# Patient Record
Sex: Male | Born: 1945
Health system: Southern US, Community
[De-identification: ages and names within clinical notes are randomized; demographics above are authoritative.]

## PROBLEM LIST (undated history)

## (undated) DIAGNOSIS — N400 Enlarged prostate without lower urinary tract symptoms: Secondary | ICD-10-CM

## (undated) DIAGNOSIS — D126 Benign neoplasm of colon, unspecified: Secondary | ICD-10-CM

## (undated) DIAGNOSIS — K5790 Diverticulosis of intestine, part unspecified, without perforation or abscess without bleeding: Secondary | ICD-10-CM

## (undated) DIAGNOSIS — I10 Essential (primary) hypertension: Secondary | ICD-10-CM

## (undated) DIAGNOSIS — M1990 Unspecified osteoarthritis, unspecified site: Secondary | ICD-10-CM

## (undated) DIAGNOSIS — N529 Male erectile dysfunction, unspecified: Secondary | ICD-10-CM

## (undated) DIAGNOSIS — IMO0002 Reserved for concepts with insufficient information to code with codable children: Secondary | ICD-10-CM

## (undated) DIAGNOSIS — E785 Hyperlipidemia, unspecified: Secondary | ICD-10-CM

## (undated) HISTORY — PX: TOTAL HIP ARTHROPLASTY: SHX124

## (undated) HISTORY — DX: Benign neoplasm of colon, unspecified: D12.6

## (undated) HISTORY — DX: Male erectile dysfunction, unspecified: N52.9

## (undated) HISTORY — PX: TONSILLECTOMY: SUR1361

## (undated) HISTORY — PX: LUMBAR LAMINECTOMY: SHX95

## (undated) HISTORY — DX: Unspecified osteoarthritis, unspecified site: M19.90

## (undated) HISTORY — DX: Essential (primary) hypertension: I10

## (undated) HISTORY — DX: Reserved for concepts with insufficient information to code with codable children: IMO0002

## (undated) HISTORY — PX: EYE SURGERY: SHX253

## (undated) HISTORY — DX: Diverticulosis of intestine, part unspecified, without perforation or abscess without bleeding: K57.90

## (undated) HISTORY — DX: Benign prostatic hyperplasia without lower urinary tract symptoms: N40.0

## (undated) HISTORY — PX: REPLANTATION FINGER: SUR1229

## (undated) HISTORY — DX: Hyperlipidemia, unspecified: E78.5

---

## 1995-10-12 HISTORY — PX: CERVICAL FUSION: SHX112

## 2003-06-20 DIAGNOSIS — D126 Benign neoplasm of colon, unspecified: Secondary | ICD-10-CM

## 2003-06-20 HISTORY — DX: Benign neoplasm of colon, unspecified: D12.6

## 2005-05-25 ENCOUNTER — Ambulatory Visit (HOSPITAL_COMMUNITY): Admission: RE | Admit: 2005-05-25 | Discharge: 2005-05-25 | Payer: Self-pay | Admitting: Urology

## 2007-02-15 ENCOUNTER — Ambulatory Visit: Payer: Self-pay | Admitting: Gastroenterology

## 2007-04-04 ENCOUNTER — Encounter: Payer: Self-pay | Admitting: Gastroenterology

## 2007-04-04 ENCOUNTER — Ambulatory Visit: Payer: Self-pay | Admitting: Gastroenterology

## 2011-08-11 ENCOUNTER — Emergency Department (HOSPITAL_COMMUNITY)
Admission: EM | Admit: 2011-08-11 | Discharge: 2011-08-11 | Disposition: A | Discharge status: Home / Self Care | Payer: BC Managed Care – PPO | Attending: Emergency Medicine | Admitting: Emergency Medicine

## 2011-08-11 DIAGNOSIS — Y92009 Unspecified place in unspecified non-institutional (private) residence as the place of occurrence of the external cause: Secondary | ICD-10-CM | POA: Insufficient documentation

## 2011-08-11 DIAGNOSIS — E7800 Pure hypercholesterolemia, unspecified: Secondary | ICD-10-CM | POA: Insufficient documentation

## 2011-08-11 DIAGNOSIS — S0990XA Unspecified injury of head, initial encounter: Secondary | ICD-10-CM | POA: Insufficient documentation

## 2011-08-11 DIAGNOSIS — S0100XA Unspecified open wound of scalp, initial encounter: Secondary | ICD-10-CM | POA: Insufficient documentation

## 2011-08-11 DIAGNOSIS — I10 Essential (primary) hypertension: Secondary | ICD-10-CM | POA: Insufficient documentation

## 2011-08-11 DIAGNOSIS — W010XXA Fall on same level from slipping, tripping and stumbling without subsequent striking against object, initial encounter: Secondary | ICD-10-CM | POA: Insufficient documentation

## 2012-01-05 ENCOUNTER — Encounter: Payer: Self-pay | Admitting: Gastroenterology

## 2013-01-12 ENCOUNTER — Encounter: Payer: Self-pay | Admitting: Gastroenterology

## 2013-07-13 ENCOUNTER — Encounter: Payer: Self-pay | Admitting: Gastroenterology

## 2013-08-16 ENCOUNTER — Ambulatory Visit (INDEPENDENT_AMBULATORY_CARE_PROVIDER_SITE_OTHER): Payer: Medicare Other | Admitting: Gastroenterology

## 2013-08-16 ENCOUNTER — Encounter: Payer: Self-pay | Admitting: Gastroenterology

## 2013-08-16 VITALS — BP 114/84 | HR 76 | Ht 64.17 in | Wt 167.2 lb

## 2013-08-16 DIAGNOSIS — K5732 Diverticulitis of large intestine without perforation or abscess without bleeding: Secondary | ICD-10-CM

## 2013-08-16 DIAGNOSIS — Z86010 Personal history of colonic polyps: Secondary | ICD-10-CM | POA: Insufficient documentation

## 2013-08-16 MED ORDER — METRONIDAZOLE 500 MG PO TABS: 500.0000 mg | ORAL_TABLET | Freq: Three times a day (TID) | ORAL | Status: DC

## 2013-08-16 MED ORDER — CIPROFLOXACIN HCL 500 MG PO TABS: 500.0000 mg | ORAL_TABLET | Freq: Two times a day (BID) | ORAL | Status: DC

## 2013-08-16 MED ORDER — SOD PICOSULFATE-MAG OX-CIT ACD 10-3.5-12 MG-GM-GM PO PACK: 1.0000 | PACK | ORAL | Status: DC

## 2013-08-16 NOTE — Patient Instructions (Signed)
We have sent the following medications to your pharmacy for you to pick up at your convenience: Cipro and Flagyl.  You have been scheduled for a colonoscopy with propofol. Please follow written instructions given to you at your visit today.  Please pick up your prep kit at the pharmacy within the next 1-3 days. If you use inhalers (even only as needed), please bring them with you on the day of your procedure.  Thank you for choosing me and [facility].  Venita Lick. Pleas Koch., MD., Clementeen Graham  cc: Rodrigo Ran, MD

## 2013-08-16 NOTE — Progress Notes (Signed)
    History of Present Illness: This is a 67 year old male with a history of adenomatous colon polyps and diverticulosis. He has been treated for several episodes of diverticulitis with Cipro and Flagyl over the past several years. He states he develops pain across his lower abdomen associated with mild constipation. Start antibiotics quickly his symptoms over the course of 3-5 days if there is a delay in antibiotics his symptoms worsen and persist for 5-7 days. He recently noted the onset of these symptoms began Cipro and Flagyl on his own yesterday. Denies weight loss, diarrhea, change in stool caliber, melena, hematochezia, nausea, vomiting, dysphagia, reflux symptoms, chest pain.  Review of Systems: Pertinent positive and negative review of systems were noted in the above HPI section. All other review of systems were otherwise negative.  Current Medications, Allergies, Past Medical History, Past Surgical History, Family History and Social History were reviewed in [employer] record.  Physical Exam: General: Well developed , well nourished, no acute distress Head: Normocephalic and atraumatic Eyes:  sclerae anicteric, EOMI Ears: Normal auditory acuity Mouth: No deformity or lesions Neck: Supple, no masses or thyromegaly Lungs: Clear throughout to auscultation Heart: Regular rate and rhythm; no murmurs, rubs or bruits Abdomen: Soft, minimal lower abdominal tenderness to deep palpation-primarily suprapubic area, and non distended. No masses, hepatosplenomegaly or hernias noted. Normal Bowel sounds Rectal: deferred for colonoscopy Musculoskeletal: Symmetrical with no gross deformities  Skin: No lesions on visible extremities Pulses:  Normal pulses noted Extremities: No clubbing, cyanosis, edema or deformities noted Neurological: Alert oriented x 4, grossly nonfocal Cervical Nodes:  No significant cervical adenopathy Inguinal Nodes: No significant inguinal  adenopathy Psychological:  Alert and cooperative. Normal mood and affect  Assessment and Recommendations:  1. Possible recurrent diverticulitis. Cipro and Flagyl for 10 days. Long-term high fiber diet with adequate daily water intake.  2. Personal history of adenomatous colon polyps. He is overdue for surveillance colonoscopy. Schedule colonoscopy. The risks, benefits, and alternatives to colonoscopy with possible biopsy and possible polypectomy were discussed with the patient and they consent to proceed.

## 2013-09-21 ENCOUNTER — Encounter: Payer: Self-pay | Admitting: Gastroenterology

## 2013-09-21 ENCOUNTER — Ambulatory Visit (AMBULATORY_SURGERY_CENTER): Payer: Medicare Other | Admitting: Gastroenterology

## 2013-09-21 VITALS — BP 119/77 | HR 57 | Temp 97.5°F | Resp 13 | Ht 64.0 in | Wt 167.0 lb

## 2013-09-21 DIAGNOSIS — I10 Essential (primary) hypertension: Secondary | ICD-10-CM | POA: Diagnosis not present

## 2013-09-21 DIAGNOSIS — E785 Hyperlipidemia, unspecified: Secondary | ICD-10-CM | POA: Diagnosis not present

## 2013-09-21 DIAGNOSIS — D126 Benign neoplasm of colon, unspecified: Secondary | ICD-10-CM

## 2013-09-21 DIAGNOSIS — Z86010 Personal history of colonic polyps: Secondary | ICD-10-CM

## 2013-09-21 MED ORDER — SODIUM CHLORIDE 0.9 % IV SOLN: 500.0000 mL | INTRAVENOUS | Status: DC

## 2013-09-21 NOTE — Patient Instructions (Addendum)
2 polyps removed and sent to pathology  Moderate diverticulosis Internal hemorrhoids High fiber diet recommended Repeat colonoscopy in 5 years    YOU HAD AN ENDOSCOPIC PROCEDURE TODAY AT THE [facility]: Refer to the procedure report that was given to you for any specific questions about what was found during the examination.  If the procedure report does not answer your questions, please call your gastroenterologist to clarify.  If you requested that your care partner not be given the details of your procedure findings, then the procedure report has been included in a sealed envelope for you to review at your convenience later.  YOU SHOULD EXPECT: Some feelings of bloating in the abdomen. Passage of more gas than usual.  Walking can help get rid of the air that was put into your GI tract during the procedure and reduce the bloating. If you had a lower endoscopy (such as a colonoscopy or flexible sigmoidoscopy) you may notice spotting of blood in your stool or on the toilet paper. If you underwent a bowel prep for your procedure, then you may not have a normal bowel movement for a few days.  DIET: Your first meal following the procedure should be a light meal and then it is ok to progress to your normal diet.  A half-sandwich or bowl of soup is an example of a good first meal.  Heavy or fried foods are harder to digest and may make you feel nauseous or bloated.  Likewise meals heavy in dairy and vegetables can cause extra gas to form and this can also increase the bloating.  Drink plenty of fluids but you should avoid alcoholic beverages for 24 hours.  ACTIVITY: Your care partner should take you home directly after the procedure.  You should plan to take it easy, moving slowly for the rest of the day.  You can resume normal activity the day after the procedure however you should NOT DRIVE or use heavy machinery for 24 hours (because of the sedation medicines used during the test).     SYMPTOMS TO REPORT IMMEDIATELY: A gastroenterologist can be reached at any hour.  During normal business hours, 8:30 AM to 5:00 PM Monday through Friday, call [phone].  After hours and on weekends, please call the GI answering service at [phone] who will take a message and have the physician on call contact you.   Following lower endoscopy (colonoscopy or flexible sigmoidoscopy):  Excessive amounts of blood in the stool  Significant tenderness or worsening of abdominal pains  Swelling of the abdomen that is new, acute  Fever of 100F or higher  FOLLOW UP: If any biopsies were taken you will be contacted by phone or by letter within the next 1-3 weeks.  Call your gastroenterologist if you have not heard about the biopsies in 3 weeks.  Our staff will call the home number listed on your records the next business day following your procedure to check on you and address any questions or concerns that you may have at that time regarding the information given to you following your procedure. This is a courtesy call and so if there is no answer at the home number and we have not heard from you through the emergency physician on call, we will assume that you have returned to your regular daily activities without incident.  SIGNATURES/CONFIDENTIALITY: You and/or your care partner have signed paperwork which will be entered into your electronic medical record.  These signatures attest to the fact  that that the information above on your After Visit Summary has been reviewed and is understood.  Full responsibility of the confidentiality of this discharge information lies with you and/or your care-partner.

## 2013-09-21 NOTE — Progress Notes (Signed)
Called to room to assist during endoscopic procedure.  Patient ID and intended procedure confirmed with present staff. Received instructions for my participation in the procedure from the performing physician.  

## 2013-09-21 NOTE — Progress Notes (Signed)
stable to RR 

## 2013-09-21 NOTE — Progress Notes (Signed)
Patient did not experience any of the following events: a burn prior to discharge; a fall within the facility; wrong site/side/patient/procedure/implant event; or a hospital transfer or hospital admission upon discharge from the facility. (G8907) Patient did not have preoperative order for IV antibiotic SSI prophylaxis. (G8918)  

## 2013-09-21 NOTE — Op Note (Signed)
[facility] [address] [place], [address]   COLONOSCOPY PROCEDURE REPORT  PATIENT: Frank Vance, Frank Vance  MR#: [id number] BIRTHDATE: 1946/02/03 , 67  yrs. old GENDER: Male ENDOSCOPIST: Meryl Dare, MD, [facility] PROCEDURE DATE:  09/21/2013 PROCEDURE:   Colonoscopy with snare polypectomy First Screening Colonoscopy - Avg.  risk and is 50 yrs.  old or older - No.  Prior Negative Screening - Now for repeat screening. N/A  History of Adenoma - Now for follow-up colonoscopy & has been > or = to 3 yrs.  Yes hx of adenoma.  Has been 3 or more years since last colonoscopy.  Polyps Removed Today? Yes. ASA CLASS:   Class II INDICATIONS:Patient's personal history of adenomatous colon polyps.  MEDICATIONS: MAC sedation, administered by CRNA and propofol (Diprivan) 250mg  IV DESCRIPTION OF PROCEDURE:   After the risks benefits and alternatives of the procedure were thoroughly explained, informed consent was obtained.  A digital rectal exam revealed no abnormalities of the rectum.   The LB [id number]  endoscope was introduced through the anus and advanced to the cecum, which was identified by both the appendix and ileocecal valve. No adverse events experienced.   The quality of the prep was Prepopik adequate. Spasm noted in several areas especially in the transvere, descending and sigmoid colon.  The instrument was then slowly withdrawn as the colon was fully examined.  COLON FINDINGS: Two sessile polyps measuring 5-6 mm in size were found in the transverse colon.  A polypectomy was performed with a cold snare.  The resection was complete and the polyp tissue was completely retrieved.   Moderate diverticulosis was noted in the descending colon and sigmoid colon.   The colon was otherwise normal.  There was no diverticulosis, inflammation, polyps or cancers unless previously stated.  Retroflexed views revealed moderate internal hemorrhoids. The time to cecum=1 minutes  17 seconds.  Withdrawal time=10 minutes 23 seconds.  The scope was withdrawn and the procedure completed. COMPLICATIONS: There were no complications.  ENDOSCOPIC IMPRESSION: 1.   Two sessile polyps measuring 5-6 mm in the transverse colon; polypectomy performed with a cold snare 2.   Moderate diverticulosis in the descending colon and sigmoid colon 3.   Moderate internal hemorrhoids  RECOMMENDATIONS: 1.  Await pathology results 2.  High fiber diet with liberal fluid intake. 3.  Repeat Colonoscopy in 5 years.  eSigned:  Meryl Dare, MD, Clementeen Graham 09/21/2013 2:32 PM   cc: Rodrigo Ran, MD

## 2013-09-24 ENCOUNTER — Telehealth: Payer: Self-pay | Admitting: *Deleted

## 2013-09-24 NOTE — Telephone Encounter (Signed)
Lm on vm that identifies pt by first and last name to return call if questions concerns or problems. ewm

## 2013-09-25 ENCOUNTER — Encounter: Payer: Self-pay | Admitting: Gastroenterology

## 2013-11-19 DIAGNOSIS — H6690 Otitis media, unspecified, unspecified ear: Secondary | ICD-10-CM | POA: Diagnosis not present

## 2013-11-19 DIAGNOSIS — Z6827 Body mass index (BMI) 27.0-27.9, adult: Secondary | ICD-10-CM | POA: Diagnosis not present

## 2013-11-19 DIAGNOSIS — J309 Allergic rhinitis, unspecified: Secondary | ICD-10-CM | POA: Diagnosis not present

## 2013-11-19 DIAGNOSIS — I10 Essential (primary) hypertension: Secondary | ICD-10-CM | POA: Diagnosis not present

## 2013-12-18 DIAGNOSIS — H7409 Tympanosclerosis, unspecified ear: Secondary | ICD-10-CM | POA: Diagnosis not present

## 2013-12-18 DIAGNOSIS — K5730 Diverticulosis of large intestine without perforation or abscess without bleeding: Secondary | ICD-10-CM | POA: Diagnosis not present

## 2013-12-18 DIAGNOSIS — H6120 Impacted cerumen, unspecified ear: Secondary | ICD-10-CM | POA: Diagnosis not present

## 2013-12-18 DIAGNOSIS — I10 Essential (primary) hypertension: Secondary | ICD-10-CM | POA: Diagnosis not present

## 2013-12-18 DIAGNOSIS — Z1331 Encounter for screening for depression: Secondary | ICD-10-CM | POA: Diagnosis not present

## 2014-01-16 DIAGNOSIS — L821 Other seborrheic keratosis: Secondary | ICD-10-CM | POA: Diagnosis not present

## 2014-01-16 DIAGNOSIS — D2330 Other benign neoplasm of skin of unspecified part of face: Secondary | ICD-10-CM | POA: Diagnosis not present

## 2014-01-16 DIAGNOSIS — D1801 Hemangioma of skin and subcutaneous tissue: Secondary | ICD-10-CM | POA: Diagnosis not present

## 2014-01-16 DIAGNOSIS — L739 Follicular disorder, unspecified: Secondary | ICD-10-CM | POA: Diagnosis not present

## 2014-01-22 DIAGNOSIS — Z6826 Body mass index (BMI) 26.0-26.9, adult: Secondary | ICD-10-CM | POA: Diagnosis not present

## 2014-01-22 DIAGNOSIS — R7301 Impaired fasting glucose: Secondary | ICD-10-CM | POA: Diagnosis not present

## 2014-01-22 DIAGNOSIS — K5732 Diverticulitis of large intestine without perforation or abscess without bleeding: Secondary | ICD-10-CM | POA: Diagnosis not present

## 2014-01-22 DIAGNOSIS — I10 Essential (primary) hypertension: Secondary | ICD-10-CM | POA: Diagnosis not present

## 2014-01-22 DIAGNOSIS — E785 Hyperlipidemia, unspecified: Secondary | ICD-10-CM | POA: Diagnosis not present

## 2014-05-09 DIAGNOSIS — H2510 Age-related nuclear cataract, unspecified eye: Secondary | ICD-10-CM | POA: Diagnosis not present

## 2014-07-02 DIAGNOSIS — Z23 Encounter for immunization: Secondary | ICD-10-CM | POA: Diagnosis not present

## 2014-08-01 DIAGNOSIS — Z125 Encounter for screening for malignant neoplasm of prostate: Secondary | ICD-10-CM | POA: Diagnosis not present

## 2014-08-01 DIAGNOSIS — E785 Hyperlipidemia, unspecified: Secondary | ICD-10-CM | POA: Diagnosis not present

## 2014-08-01 DIAGNOSIS — I10 Essential (primary) hypertension: Secondary | ICD-10-CM | POA: Diagnosis not present

## 2014-08-01 DIAGNOSIS — R7301 Impaired fasting glucose: Secondary | ICD-10-CM | POA: Diagnosis not present

## 2014-08-08 DIAGNOSIS — Z008 Encounter for other general examination: Secondary | ICD-10-CM | POA: Diagnosis not present

## 2014-08-08 DIAGNOSIS — M5380 Other specified dorsopathies, site unspecified: Secondary | ICD-10-CM | POA: Diagnosis not present

## 2014-08-08 DIAGNOSIS — Z1389 Encounter for screening for other disorder: Secondary | ICD-10-CM | POA: Diagnosis not present

## 2014-08-08 DIAGNOSIS — D126 Benign neoplasm of colon, unspecified: Secondary | ICD-10-CM | POA: Diagnosis not present

## 2014-08-08 DIAGNOSIS — Z87438 Personal history of other diseases of male genital organs: Secondary | ICD-10-CM | POA: Diagnosis not present

## 2014-08-08 DIAGNOSIS — I10 Essential (primary) hypertension: Secondary | ICD-10-CM | POA: Diagnosis not present

## 2014-08-08 DIAGNOSIS — E785 Hyperlipidemia, unspecified: Secondary | ICD-10-CM | POA: Diagnosis not present

## 2014-08-08 DIAGNOSIS — J309 Allergic rhinitis, unspecified: Secondary | ICD-10-CM | POA: Diagnosis not present

## 2014-08-08 DIAGNOSIS — K5790 Diverticulosis of intestine, part unspecified, without perforation or abscess without bleeding: Secondary | ICD-10-CM | POA: Diagnosis not present

## 2014-08-28 DIAGNOSIS — Z1212 Encounter for screening for malignant neoplasm of rectum: Secondary | ICD-10-CM | POA: Diagnosis not present

## 2014-10-10 DIAGNOSIS — L820 Inflamed seborrheic keratosis: Secondary | ICD-10-CM | POA: Diagnosis not present

## 2014-10-15 DIAGNOSIS — I10 Essential (primary) hypertension: Secondary | ICD-10-CM | POA: Diagnosis not present

## 2014-10-15 DIAGNOSIS — M4807 Spinal stenosis, lumbosacral region: Secondary | ICD-10-CM | POA: Diagnosis not present

## 2014-10-15 DIAGNOSIS — M5136 Other intervertebral disc degeneration, lumbar region: Secondary | ICD-10-CM | POA: Diagnosis not present

## 2014-10-23 DIAGNOSIS — M4807 Spinal stenosis, lumbosacral region: Secondary | ICD-10-CM | POA: Diagnosis not present

## 2014-10-29 DIAGNOSIS — M4807 Spinal stenosis, lumbosacral region: Secondary | ICD-10-CM | POA: Diagnosis not present

## 2014-10-29 DIAGNOSIS — M5136 Other intervertebral disc degeneration, lumbar region: Secondary | ICD-10-CM | POA: Diagnosis not present

## 2015-02-05 DIAGNOSIS — I10 Essential (primary) hypertension: Secondary | ICD-10-CM | POA: Diagnosis not present

## 2015-02-05 DIAGNOSIS — Z23 Encounter for immunization: Secondary | ICD-10-CM | POA: Diagnosis not present

## 2015-02-05 DIAGNOSIS — E785 Hyperlipidemia, unspecified: Secondary | ICD-10-CM | POA: Diagnosis not present

## 2015-02-05 DIAGNOSIS — R7301 Impaired fasting glucose: Secondary | ICD-10-CM | POA: Diagnosis not present

## 2015-02-05 DIAGNOSIS — Z6828 Body mass index (BMI) 28.0-28.9, adult: Secondary | ICD-10-CM | POA: Diagnosis not present

## 2015-06-05 DIAGNOSIS — H2513 Age-related nuclear cataract, bilateral: Secondary | ICD-10-CM | POA: Diagnosis not present

## 2015-07-02 DIAGNOSIS — Z23 Encounter for immunization: Secondary | ICD-10-CM | POA: Diagnosis not present

## 2015-08-15 DIAGNOSIS — I10 Essential (primary) hypertension: Secondary | ICD-10-CM | POA: Diagnosis not present

## 2015-08-15 DIAGNOSIS — Z125 Encounter for screening for malignant neoplasm of prostate: Secondary | ICD-10-CM | POA: Diagnosis not present

## 2015-08-15 DIAGNOSIS — E784 Other hyperlipidemia: Secondary | ICD-10-CM | POA: Diagnosis not present

## 2015-08-15 DIAGNOSIS — R7301 Impaired fasting glucose: Secondary | ICD-10-CM | POA: Diagnosis not present

## 2015-08-22 DIAGNOSIS — R7301 Impaired fasting glucose: Secondary | ICD-10-CM | POA: Diagnosis not present

## 2015-08-22 DIAGNOSIS — I10 Essential (primary) hypertension: Secondary | ICD-10-CM | POA: Diagnosis not present

## 2015-08-22 DIAGNOSIS — E785 Hyperlipidemia, unspecified: Secondary | ICD-10-CM | POA: Diagnosis not present

## 2015-08-22 DIAGNOSIS — N529 Male erectile dysfunction, unspecified: Secondary | ICD-10-CM | POA: Diagnosis not present

## 2015-08-22 DIAGNOSIS — M5380 Other specified dorsopathies, site unspecified: Secondary | ICD-10-CM | POA: Diagnosis not present

## 2015-08-22 DIAGNOSIS — Z1389 Encounter for screening for other disorder: Secondary | ICD-10-CM | POA: Diagnosis not present

## 2015-08-22 DIAGNOSIS — D126 Benign neoplasm of colon, unspecified: Secondary | ICD-10-CM | POA: Diagnosis not present

## 2015-08-22 DIAGNOSIS — N401 Enlarged prostate with lower urinary tract symptoms: Secondary | ICD-10-CM | POA: Diagnosis not present

## 2015-08-22 DIAGNOSIS — Z6828 Body mass index (BMI) 28.0-28.9, adult: Secondary | ICD-10-CM | POA: Diagnosis not present

## 2015-08-22 DIAGNOSIS — Z0000 Encounter for general adult medical examination without abnormal findings: Secondary | ICD-10-CM | POA: Diagnosis not present

## 2015-08-22 DIAGNOSIS — J309 Allergic rhinitis, unspecified: Secondary | ICD-10-CM | POA: Diagnosis not present

## 2015-08-26 DIAGNOSIS — Z1212 Encounter for screening for malignant neoplasm of rectum: Secondary | ICD-10-CM | POA: Diagnosis not present

## 2016-05-07 DIAGNOSIS — R2241 Localized swelling, mass and lump, right lower limb: Secondary | ICD-10-CM | POA: Diagnosis not present

## 2016-05-07 DIAGNOSIS — Z6827 Body mass index (BMI) 27.0-27.9, adult: Secondary | ICD-10-CM | POA: Diagnosis not present

## 2016-05-07 DIAGNOSIS — L0889 Other specified local infections of the skin and subcutaneous tissue: Secondary | ICD-10-CM | POA: Diagnosis not present

## 2016-06-25 DIAGNOSIS — Z23 Encounter for immunization: Secondary | ICD-10-CM | POA: Diagnosis not present

## 2016-09-07 DIAGNOSIS — N401 Enlarged prostate with lower urinary tract symptoms: Secondary | ICD-10-CM | POA: Diagnosis not present

## 2016-09-07 DIAGNOSIS — Z0000 Encounter for general adult medical examination without abnormal findings: Secondary | ICD-10-CM | POA: Diagnosis not present

## 2016-09-07 DIAGNOSIS — I10 Essential (primary) hypertension: Secondary | ICD-10-CM | POA: Diagnosis not present

## 2016-09-07 DIAGNOSIS — R2241 Localized swelling, mass and lump, right lower limb: Secondary | ICD-10-CM | POA: Diagnosis not present

## 2016-09-07 DIAGNOSIS — L0889 Other specified local infections of the skin and subcutaneous tissue: Secondary | ICD-10-CM | POA: Diagnosis not present

## 2016-09-07 DIAGNOSIS — Z125 Encounter for screening for malignant neoplasm of prostate: Secondary | ICD-10-CM | POA: Diagnosis not present

## 2016-09-07 DIAGNOSIS — R7301 Impaired fasting glucose: Secondary | ICD-10-CM | POA: Diagnosis not present

## 2016-09-07 DIAGNOSIS — E785 Hyperlipidemia, unspecified: Secondary | ICD-10-CM | POA: Diagnosis not present

## 2016-09-07 DIAGNOSIS — Z6827 Body mass index (BMI) 27.0-27.9, adult: Secondary | ICD-10-CM | POA: Diagnosis not present

## 2016-09-14 DIAGNOSIS — Z0000 Encounter for general adult medical examination without abnormal findings: Secondary | ICD-10-CM | POA: Diagnosis not present

## 2016-09-14 DIAGNOSIS — J3089 Other allergic rhinitis: Secondary | ICD-10-CM | POA: Diagnosis not present

## 2016-09-14 DIAGNOSIS — I10 Essential (primary) hypertension: Secondary | ICD-10-CM | POA: Diagnosis not present

## 2016-09-14 DIAGNOSIS — E784 Other hyperlipidemia: Secondary | ICD-10-CM | POA: Diagnosis not present

## 2016-09-14 DIAGNOSIS — Z1389 Encounter for screening for other disorder: Secondary | ICD-10-CM | POA: Diagnosis not present

## 2016-09-14 DIAGNOSIS — Z6827 Body mass index (BMI) 27.0-27.9, adult: Secondary | ICD-10-CM | POA: Diagnosis not present

## 2016-09-14 DIAGNOSIS — K5790 Diverticulosis of intestine, part unspecified, without perforation or abscess without bleeding: Secondary | ICD-10-CM | POA: Diagnosis not present

## 2016-09-14 DIAGNOSIS — R7301 Impaired fasting glucose: Secondary | ICD-10-CM | POA: Diagnosis not present

## 2016-09-14 DIAGNOSIS — D126 Benign neoplasm of colon, unspecified: Secondary | ICD-10-CM | POA: Diagnosis not present

## 2016-09-14 DIAGNOSIS — N401 Enlarged prostate with lower urinary tract symptoms: Secondary | ICD-10-CM | POA: Diagnosis not present

## 2016-09-14 DIAGNOSIS — N528 Other male erectile dysfunction: Secondary | ICD-10-CM | POA: Diagnosis not present

## 2016-09-14 DIAGNOSIS — M5380 Other specified dorsopathies, site unspecified: Secondary | ICD-10-CM | POA: Diagnosis not present

## 2016-09-27 DIAGNOSIS — Z1212 Encounter for screening for malignant neoplasm of rectum: Secondary | ICD-10-CM | POA: Diagnosis not present

## 2017-05-07 DIAGNOSIS — S6991XA Unspecified injury of right wrist, hand and finger(s), initial encounter: Secondary | ICD-10-CM | POA: Diagnosis not present

## 2017-05-07 DIAGNOSIS — S68128A Partial traumatic metacarpophalangeal amputation of other finger, initial encounter: Secondary | ICD-10-CM | POA: Diagnosis not present

## 2017-05-07 DIAGNOSIS — S62630B Displaced fracture of distal phalanx of right index finger, initial encounter for open fracture: Secondary | ICD-10-CM | POA: Diagnosis not present

## 2017-05-07 DIAGNOSIS — I10 Essential (primary) hypertension: Secondary | ICD-10-CM | POA: Diagnosis not present

## 2017-05-07 DIAGNOSIS — S63290A Dislocation of distal interphalangeal joint of right index finger, initial encounter: Secondary | ICD-10-CM | POA: Diagnosis not present

## 2017-05-07 DIAGNOSIS — S68620A Partial traumatic transphalangeal amputation of right index finger, initial encounter: Secondary | ICD-10-CM | POA: Diagnosis not present

## 2017-05-07 DIAGNOSIS — S56421A Laceration of extensor muscle, fascia and tendon of right index finger at forearm level, initial encounter: Secondary | ICD-10-CM | POA: Diagnosis not present

## 2017-05-07 DIAGNOSIS — W3081XA Contact with agricultural transport vehicle in stationary use, initial encounter: Secondary | ICD-10-CM | POA: Diagnosis not present

## 2017-05-07 DIAGNOSIS — S61300A Unspecified open wound of right index finger with damage to nail, initial encounter: Secondary | ICD-10-CM | POA: Diagnosis not present

## 2017-05-07 DIAGNOSIS — M19041 Primary osteoarthritis, right hand: Secondary | ICD-10-CM | POA: Diagnosis not present

## 2017-05-07 DIAGNOSIS — S66320A Laceration of extensor muscle, fascia and tendon of right index finger at wrist and hand level, initial encounter: Secondary | ICD-10-CM | POA: Diagnosis not present

## 2017-05-09 DIAGNOSIS — S68110A Complete traumatic metacarpophalangeal amputation of right index finger, initial encounter: Secondary | ICD-10-CM | POA: Diagnosis not present

## 2017-05-09 DIAGNOSIS — M79644 Pain in right finger(s): Secondary | ICD-10-CM | POA: Diagnosis not present

## 2017-05-09 DIAGNOSIS — S6981XA Other specified injuries of right wrist, hand and finger(s), initial encounter: Secondary | ICD-10-CM | POA: Diagnosis not present

## 2017-05-13 DIAGNOSIS — S68110D Complete traumatic metacarpophalangeal amputation of right index finger, subsequent encounter: Secondary | ICD-10-CM | POA: Diagnosis not present

## 2017-05-13 DIAGNOSIS — M79644 Pain in right finger(s): Secondary | ICD-10-CM | POA: Diagnosis not present

## 2017-05-19 DIAGNOSIS — M79644 Pain in right finger(s): Secondary | ICD-10-CM | POA: Diagnosis not present

## 2017-05-19 DIAGNOSIS — S6981XD Other specified injuries of right wrist, hand and finger(s), subsequent encounter: Secondary | ICD-10-CM | POA: Diagnosis not present

## 2017-05-19 DIAGNOSIS — S68110D Complete traumatic metacarpophalangeal amputation of right index finger, subsequent encounter: Secondary | ICD-10-CM | POA: Diagnosis not present

## 2017-06-02 DIAGNOSIS — S68110D Complete traumatic metacarpophalangeal amputation of right index finger, subsequent encounter: Secondary | ICD-10-CM | POA: Diagnosis not present

## 2017-06-02 DIAGNOSIS — M79644 Pain in right finger(s): Secondary | ICD-10-CM | POA: Diagnosis not present

## 2017-06-20 DIAGNOSIS — S68110D Complete traumatic metacarpophalangeal amputation of right index finger, subsequent encounter: Secondary | ICD-10-CM | POA: Diagnosis not present

## 2017-06-20 DIAGNOSIS — M65331 Trigger finger, right middle finger: Secondary | ICD-10-CM | POA: Diagnosis not present

## 2017-06-23 DIAGNOSIS — M79644 Pain in right finger(s): Secondary | ICD-10-CM | POA: Diagnosis not present

## 2017-06-30 DIAGNOSIS — M79644 Pain in right finger(s): Secondary | ICD-10-CM | POA: Diagnosis not present

## 2017-07-04 DIAGNOSIS — S6981XD Other specified injuries of right wrist, hand and finger(s), subsequent encounter: Secondary | ICD-10-CM | POA: Diagnosis not present

## 2017-07-04 DIAGNOSIS — M65331 Trigger finger, right middle finger: Secondary | ICD-10-CM | POA: Diagnosis not present

## 2017-07-04 DIAGNOSIS — M20011 Mallet finger of right finger(s): Secondary | ICD-10-CM | POA: Diagnosis not present

## 2017-07-07 DIAGNOSIS — M79644 Pain in right finger(s): Secondary | ICD-10-CM | POA: Diagnosis not present

## 2017-07-13 DIAGNOSIS — M79644 Pain in right finger(s): Secondary | ICD-10-CM | POA: Diagnosis not present

## 2017-07-25 DIAGNOSIS — M79644 Pain in right finger(s): Secondary | ICD-10-CM | POA: Diagnosis not present

## 2017-07-25 DIAGNOSIS — S6981XD Other specified injuries of right wrist, hand and finger(s), subsequent encounter: Secondary | ICD-10-CM | POA: Diagnosis not present

## 2017-07-26 DIAGNOSIS — M79644 Pain in right finger(s): Secondary | ICD-10-CM | POA: Diagnosis not present

## 2017-08-03 DIAGNOSIS — M79644 Pain in right finger(s): Secondary | ICD-10-CM | POA: Diagnosis not present

## 2017-08-23 DIAGNOSIS — M79644 Pain in right finger(s): Secondary | ICD-10-CM | POA: Diagnosis not present

## 2017-08-23 DIAGNOSIS — S6981XD Other specified injuries of right wrist, hand and finger(s), subsequent encounter: Secondary | ICD-10-CM | POA: Diagnosis not present

## 2017-10-21 DIAGNOSIS — M79641 Pain in right hand: Secondary | ICD-10-CM | POA: Diagnosis not present

## 2017-10-21 DIAGNOSIS — S6981XD Other specified injuries of right wrist, hand and finger(s), subsequent encounter: Secondary | ICD-10-CM | POA: Diagnosis not present

## 2017-11-08 DIAGNOSIS — H2513 Age-related nuclear cataract, bilateral: Secondary | ICD-10-CM | POA: Diagnosis not present

## 2017-11-08 DIAGNOSIS — H524 Presbyopia: Secondary | ICD-10-CM | POA: Diagnosis not present

## 2017-11-08 DIAGNOSIS — H5213 Myopia, bilateral: Secondary | ICD-10-CM | POA: Diagnosis not present

## 2017-12-14 DIAGNOSIS — Z125 Encounter for screening for malignant neoplasm of prostate: Secondary | ICD-10-CM | POA: Diagnosis not present

## 2017-12-14 DIAGNOSIS — I10 Essential (primary) hypertension: Secondary | ICD-10-CM | POA: Diagnosis not present

## 2017-12-14 DIAGNOSIS — R7301 Impaired fasting glucose: Secondary | ICD-10-CM | POA: Diagnosis not present

## 2017-12-14 DIAGNOSIS — E7849 Other hyperlipidemia: Secondary | ICD-10-CM | POA: Diagnosis not present

## 2017-12-14 DIAGNOSIS — R82998 Other abnormal findings in urine: Secondary | ICD-10-CM | POA: Diagnosis not present

## 2017-12-21 DIAGNOSIS — L0889 Other specified local infections of the skin and subcutaneous tissue: Secondary | ICD-10-CM | POA: Diagnosis not present

## 2017-12-21 DIAGNOSIS — H7409 Tympanosclerosis, unspecified ear: Secondary | ICD-10-CM | POA: Diagnosis not present

## 2017-12-21 DIAGNOSIS — Z0000 Encounter for general adult medical examination without abnormal findings: Secondary | ICD-10-CM | POA: Diagnosis not present

## 2017-12-21 DIAGNOSIS — Z6827 Body mass index (BMI) 27.0-27.9, adult: Secondary | ICD-10-CM | POA: Diagnosis not present

## 2017-12-21 DIAGNOSIS — R7301 Impaired fasting glucose: Secondary | ICD-10-CM | POA: Diagnosis not present

## 2017-12-21 DIAGNOSIS — N528 Other male erectile dysfunction: Secondary | ICD-10-CM | POA: Diagnosis not present

## 2017-12-21 DIAGNOSIS — N401 Enlarged prostate with lower urinary tract symptoms: Secondary | ICD-10-CM | POA: Diagnosis not present

## 2017-12-21 DIAGNOSIS — M5380 Other specified dorsopathies, site unspecified: Secondary | ICD-10-CM | POA: Diagnosis not present

## 2017-12-21 DIAGNOSIS — D126 Benign neoplasm of colon, unspecified: Secondary | ICD-10-CM | POA: Diagnosis not present

## 2017-12-21 DIAGNOSIS — E7849 Other hyperlipidemia: Secondary | ICD-10-CM | POA: Diagnosis not present

## 2017-12-21 DIAGNOSIS — K5790 Diverticulosis of intestine, part unspecified, without perforation or abscess without bleeding: Secondary | ICD-10-CM | POA: Diagnosis not present

## 2017-12-21 DIAGNOSIS — J3089 Other allergic rhinitis: Secondary | ICD-10-CM | POA: Diagnosis not present

## 2017-12-29 DIAGNOSIS — Z1212 Encounter for screening for malignant neoplasm of rectum: Secondary | ICD-10-CM | POA: Diagnosis not present

## 2018-01-02 DIAGNOSIS — H11441 Conjunctival cysts, right eye: Secondary | ICD-10-CM | POA: Diagnosis not present

## 2018-05-17 DIAGNOSIS — H25013 Cortical age-related cataract, bilateral: Secondary | ICD-10-CM | POA: Diagnosis not present

## 2018-05-17 DIAGNOSIS — H5371 Glare sensitivity: Secondary | ICD-10-CM | POA: Diagnosis not present

## 2018-05-17 DIAGNOSIS — H2513 Age-related nuclear cataract, bilateral: Secondary | ICD-10-CM | POA: Diagnosis not present

## 2018-05-17 DIAGNOSIS — H538 Other visual disturbances: Secondary | ICD-10-CM | POA: Diagnosis not present

## 2018-07-18 DIAGNOSIS — Z23 Encounter for immunization: Secondary | ICD-10-CM | POA: Diagnosis not present

## 2018-08-31 DIAGNOSIS — H2513 Age-related nuclear cataract, bilateral: Secondary | ICD-10-CM | POA: Diagnosis not present

## 2018-08-31 DIAGNOSIS — H25013 Cortical age-related cataract, bilateral: Secondary | ICD-10-CM | POA: Diagnosis not present

## 2018-09-06 DIAGNOSIS — H2513 Age-related nuclear cataract, bilateral: Secondary | ICD-10-CM | POA: Diagnosis not present

## 2018-09-06 DIAGNOSIS — H25013 Cortical age-related cataract, bilateral: Secondary | ICD-10-CM | POA: Diagnosis not present

## 2018-09-11 ENCOUNTER — Encounter: Payer: Self-pay | Admitting: Gastroenterology

## 2018-09-13 DIAGNOSIS — H2512 Age-related nuclear cataract, left eye: Secondary | ICD-10-CM | POA: Diagnosis not present

## 2018-09-13 DIAGNOSIS — H25011 Cortical age-related cataract, right eye: Secondary | ICD-10-CM | POA: Diagnosis not present

## 2018-09-13 DIAGNOSIS — H25012 Cortical age-related cataract, left eye: Secondary | ICD-10-CM | POA: Diagnosis not present

## 2018-09-13 DIAGNOSIS — H2511 Age-related nuclear cataract, right eye: Secondary | ICD-10-CM | POA: Diagnosis not present

## 2018-09-20 DIAGNOSIS — H25012 Cortical age-related cataract, left eye: Secondary | ICD-10-CM | POA: Diagnosis not present

## 2018-09-20 DIAGNOSIS — H2512 Age-related nuclear cataract, left eye: Secondary | ICD-10-CM | POA: Diagnosis not present

## 2018-10-15 ENCOUNTER — Encounter: Payer: Self-pay | Admitting: Gastroenterology

## 2019-01-30 DIAGNOSIS — E7849 Other hyperlipidemia: Secondary | ICD-10-CM | POA: Diagnosis not present

## 2019-01-30 DIAGNOSIS — I10 Essential (primary) hypertension: Secondary | ICD-10-CM | POA: Diagnosis not present

## 2019-01-30 DIAGNOSIS — R7301 Impaired fasting glucose: Secondary | ICD-10-CM | POA: Diagnosis not present

## 2019-01-30 DIAGNOSIS — Z125 Encounter for screening for malignant neoplasm of prostate: Secondary | ICD-10-CM | POA: Diagnosis not present

## 2019-01-31 DIAGNOSIS — I10 Essential (primary) hypertension: Secondary | ICD-10-CM | POA: Diagnosis not present

## 2019-01-31 DIAGNOSIS — R82998 Other abnormal findings in urine: Secondary | ICD-10-CM | POA: Diagnosis not present

## 2019-02-06 ENCOUNTER — Other Ambulatory Visit: Payer: Self-pay | Admitting: Internal Medicine

## 2019-02-06 DIAGNOSIS — R7301 Impaired fasting glucose: Secondary | ICD-10-CM | POA: Diagnosis not present

## 2019-02-06 DIAGNOSIS — E785 Hyperlipidemia, unspecified: Secondary | ICD-10-CM | POA: Diagnosis not present

## 2019-02-06 DIAGNOSIS — D126 Benign neoplasm of colon, unspecified: Secondary | ICD-10-CM | POA: Diagnosis not present

## 2019-02-06 DIAGNOSIS — Z1331 Encounter for screening for depression: Secondary | ICD-10-CM | POA: Diagnosis not present

## 2019-02-06 DIAGNOSIS — H7409 Tympanosclerosis, unspecified ear: Secondary | ICD-10-CM | POA: Diagnosis not present

## 2019-02-06 DIAGNOSIS — K5790 Diverticulosis of intestine, part unspecified, without perforation or abscess without bleeding: Secondary | ICD-10-CM | POA: Diagnosis not present

## 2019-02-06 DIAGNOSIS — N529 Male erectile dysfunction, unspecified: Secondary | ICD-10-CM | POA: Diagnosis not present

## 2019-02-06 DIAGNOSIS — Z0000 Encounter for general adult medical examination without abnormal findings: Secondary | ICD-10-CM | POA: Diagnosis not present

## 2019-02-06 DIAGNOSIS — I10 Essential (primary) hypertension: Secondary | ICD-10-CM | POA: Diagnosis not present

## 2019-02-06 DIAGNOSIS — M5380 Other specified dorsopathies, site unspecified: Secondary | ICD-10-CM | POA: Diagnosis not present

## 2019-02-06 DIAGNOSIS — N401 Enlarged prostate with lower urinary tract symptoms: Secondary | ICD-10-CM | POA: Diagnosis not present

## 2019-02-13 DIAGNOSIS — Z961 Presence of intraocular lens: Secondary | ICD-10-CM | POA: Diagnosis not present

## 2019-04-16 DIAGNOSIS — H26492 Other secondary cataract, left eye: Secondary | ICD-10-CM | POA: Diagnosis not present

## 2019-04-16 DIAGNOSIS — Z961 Presence of intraocular lens: Secondary | ICD-10-CM | POA: Diagnosis not present

## 2019-10-15 DIAGNOSIS — M25551 Pain in right hip: Secondary | ICD-10-CM | POA: Diagnosis not present

## 2019-10-15 DIAGNOSIS — M25552 Pain in left hip: Secondary | ICD-10-CM | POA: Diagnosis not present

## 2019-10-23 DIAGNOSIS — M25551 Pain in right hip: Secondary | ICD-10-CM | POA: Diagnosis not present

## 2019-10-29 DIAGNOSIS — M1611 Unilateral primary osteoarthritis, right hip: Secondary | ICD-10-CM | POA: Diagnosis not present

## 2019-11-15 DIAGNOSIS — M25551 Pain in right hip: Secondary | ICD-10-CM | POA: Diagnosis not present

## 2019-11-27 DIAGNOSIS — M1611 Unilateral primary osteoarthritis, right hip: Secondary | ICD-10-CM | POA: Diagnosis not present

## 2019-12-03 ENCOUNTER — Other Ambulatory Visit: Payer: Self-pay

## 2019-12-03 ENCOUNTER — Ambulatory Visit: Payer: Medicare Other | Attending: Internal Medicine

## 2019-12-03 DIAGNOSIS — Z23 Encounter for immunization: Secondary | ICD-10-CM | POA: Insufficient documentation

## 2019-12-03 NOTE — Progress Notes (Signed)
   Covid-19 Vaccination Clinic  Name:  Frank Vance    MRN: [id number] DOB: 03/09/1946  12/03/2019  [name] was observed post Covid-19 immunization for 15 minutes without incidence. He was provided with Vaccine Information Sheet and instruction to access the V-Safe system.   [name] was instructed to call 911 with any severe reactions post vaccine: Marland Kitchen Difficulty breathing  . Swelling of your face and throat  . A fast heartbeat  . A bad rash all over your body  . Dizziness and weakness    Immunizations Administered    Name Date Dose VIS Date Route   Pfizer COVID-19 Vaccine 12/03/2019 10:22 AM 0.3 mL 09/21/2019 Intramuscular   Manufacturer: [place]   Lot: [id number]   [place]: [id number]

## 2019-12-25 ENCOUNTER — Ambulatory Visit: Payer: PPO | Attending: Internal Medicine

## 2019-12-25 DIAGNOSIS — Z23 Encounter for immunization: Secondary | ICD-10-CM

## 2019-12-25 NOTE — Progress Notes (Signed)
   Covid-19 Vaccination Clinic  Name:  Frank Vance    MRN: [id number] DOB: Jan 11, 1946  12/25/2019  [name] was observed post Covid-19 immunization for 15 minutes without incident. He was provided with Vaccine Information Sheet and instruction to access the V-Safe system.   [name] was instructed to call 911 with any severe reactions post vaccine: Marland Kitchen Difficulty breathing  . Swelling of face and throat  . A fast heartbeat  . A bad rash all over body  . Dizziness and weakness   Immunizations Administered    Name Date Dose VIS Date Route   Pfizer COVID-19 Vaccine 12/25/2019  9:13 AM 0.3 mL 09/21/2019 Intramuscular   Manufacturer: [place]   Lot: [id number]   [place]: [id number]

## 2020-01-21 DIAGNOSIS — M1611 Unilateral primary osteoarthritis, right hip: Secondary | ICD-10-CM | POA: Diagnosis not present

## 2020-02-14 DIAGNOSIS — M1611 Unilateral primary osteoarthritis, right hip: Secondary | ICD-10-CM | POA: Diagnosis not present

## 2020-02-22 DIAGNOSIS — M1611 Unilateral primary osteoarthritis, right hip: Secondary | ICD-10-CM | POA: Diagnosis not present

## 2020-03-06 DIAGNOSIS — Z125 Encounter for screening for malignant neoplasm of prostate: Secondary | ICD-10-CM | POA: Diagnosis not present

## 2020-03-06 DIAGNOSIS — R7301 Impaired fasting glucose: Secondary | ICD-10-CM | POA: Diagnosis not present

## 2020-03-06 DIAGNOSIS — E7849 Other hyperlipidemia: Secondary | ICD-10-CM | POA: Diagnosis not present

## 2020-03-07 DIAGNOSIS — Z471 Aftercare following joint replacement surgery: Secondary | ICD-10-CM | POA: Diagnosis not present

## 2020-03-07 DIAGNOSIS — Z96641 Presence of right artificial hip joint: Secondary | ICD-10-CM | POA: Diagnosis not present

## 2020-03-13 DIAGNOSIS — D126 Benign neoplasm of colon, unspecified: Secondary | ICD-10-CM | POA: Diagnosis not present

## 2020-03-13 DIAGNOSIS — N401 Enlarged prostate with lower urinary tract symptoms: Secondary | ICD-10-CM | POA: Diagnosis not present

## 2020-03-13 DIAGNOSIS — E7849 Other hyperlipidemia: Secondary | ICD-10-CM | POA: Diagnosis not present

## 2020-03-13 DIAGNOSIS — Z0000 Encounter for general adult medical examination without abnormal findings: Secondary | ICD-10-CM | POA: Diagnosis not present

## 2020-03-13 DIAGNOSIS — I10 Essential (primary) hypertension: Secondary | ICD-10-CM | POA: Diagnosis not present

## 2020-03-13 DIAGNOSIS — Z1331 Encounter for screening for depression: Secondary | ICD-10-CM | POA: Diagnosis not present

## 2020-03-13 DIAGNOSIS — R82998 Other abnormal findings in urine: Secondary | ICD-10-CM | POA: Diagnosis not present

## 2020-03-13 DIAGNOSIS — H7409 Tympanosclerosis, unspecified ear: Secondary | ICD-10-CM | POA: Diagnosis not present

## 2020-03-13 DIAGNOSIS — R7301 Impaired fasting glucose: Secondary | ICD-10-CM | POA: Diagnosis not present

## 2020-03-13 DIAGNOSIS — N529 Male erectile dysfunction, unspecified: Secondary | ICD-10-CM | POA: Diagnosis not present

## 2020-03-13 DIAGNOSIS — L0889 Other specified local infections of the skin and subcutaneous tissue: Secondary | ICD-10-CM | POA: Diagnosis not present

## 2020-03-13 DIAGNOSIS — M5380 Other specified dorsopathies, site unspecified: Secondary | ICD-10-CM | POA: Diagnosis not present

## 2020-03-13 DIAGNOSIS — H6690 Otitis media, unspecified, unspecified ear: Secondary | ICD-10-CM | POA: Diagnosis not present

## 2020-03-13 DIAGNOSIS — Z7689 Persons encountering health services in other specified circumstances: Secondary | ICD-10-CM | POA: Diagnosis not present

## 2020-03-17 ENCOUNTER — Other Ambulatory Visit: Payer: Self-pay | Admitting: Internal Medicine

## 2020-03-17 DIAGNOSIS — E785 Hyperlipidemia, unspecified: Secondary | ICD-10-CM

## 2020-04-01 DIAGNOSIS — M810 Age-related osteoporosis without current pathological fracture: Secondary | ICD-10-CM | POA: Diagnosis not present

## 2020-04-03 ENCOUNTER — Ambulatory Visit
Admission: RE | Admit: 2020-04-03 | Discharge: 2020-04-03 | Disposition: A | Discharge status: Home / Self Care | Payer: No Typology Code available for payment source | Source: Ambulatory Visit | Attending: Internal Medicine | Admitting: Internal Medicine

## 2020-04-03 DIAGNOSIS — E785 Hyperlipidemia, unspecified: Secondary | ICD-10-CM | POA: Diagnosis not present

## 2020-04-07 DIAGNOSIS — Z471 Aftercare following joint replacement surgery: Secondary | ICD-10-CM | POA: Diagnosis not present

## 2020-04-07 DIAGNOSIS — Z96641 Presence of right artificial hip joint: Secondary | ICD-10-CM | POA: Diagnosis not present

## 2020-05-26 DIAGNOSIS — M545 Low back pain: Secondary | ICD-10-CM | POA: Diagnosis not present

## 2020-05-26 DIAGNOSIS — Z96641 Presence of right artificial hip joint: Secondary | ICD-10-CM | POA: Diagnosis not present

## 2020-06-10 DIAGNOSIS — R3913 Splitting of urinary stream: Secondary | ICD-10-CM | POA: Diagnosis not present

## 2020-06-10 DIAGNOSIS — N401 Enlarged prostate with lower urinary tract symptoms: Secondary | ICD-10-CM | POA: Diagnosis not present

## 2020-06-10 DIAGNOSIS — R310 Gross hematuria: Secondary | ICD-10-CM | POA: Diagnosis not present

## 2020-07-15 DIAGNOSIS — I10 Essential (primary) hypertension: Secondary | ICD-10-CM | POA: Diagnosis not present

## 2020-07-15 DIAGNOSIS — R634 Abnormal weight loss: Secondary | ICD-10-CM | POA: Diagnosis not present

## 2020-07-15 DIAGNOSIS — Z23 Encounter for immunization: Secondary | ICD-10-CM | POA: Diagnosis not present

## 2020-07-31 ENCOUNTER — Encounter: Payer: Self-pay | Admitting: Gastroenterology

## 2020-08-14 ENCOUNTER — Ambulatory Visit: Payer: PPO | Admitting: Gastroenterology

## 2020-08-14 ENCOUNTER — Encounter: Payer: Self-pay | Admitting: Gastroenterology

## 2020-08-14 VITALS — BP 150/100 | HR 64 | Ht 63.39 in | Wt 151.4 lb

## 2020-08-14 DIAGNOSIS — R109 Unspecified abdominal pain: Secondary | ICD-10-CM | POA: Diagnosis not present

## 2020-08-14 DIAGNOSIS — Z86010 Personal history of colonic polyps: Secondary | ICD-10-CM | POA: Diagnosis not present

## 2020-08-14 DIAGNOSIS — R634 Abnormal weight loss: Secondary | ICD-10-CM | POA: Diagnosis not present

## 2020-08-14 DIAGNOSIS — R194 Change in bowel habit: Secondary | ICD-10-CM

## 2020-08-14 MED ORDER — SUPREP BOWEL PREP KIT 17.5-3.13-1.6 GM/177ML PO SOLN: 1.0000 | ORAL | 0 refills | Status: DC

## 2020-08-14 NOTE — Progress Notes (Signed)
08/14/2020 Frank Vance [id number] [birth year]   HISTORY OF PRESENT ILLNESS: This is a 74 year old male who is a patient of Dr. Lynne Leader.  He is an Forensic psychologist.  His last colonoscopy was performed in December 2014 at which time he was found to have 2 polyps removed, one being a tubular adenoma and the other hyperplastic polyp.  He also had moderate diverticulosis and internal hemorrhoids.  Repeat colonoscopy was recommended a 5-year interval.  From Dr. Lynne Leader note in November 2014 it appears that he has been treated for several episodes of diverticulitis and Flagyl over the years.  He states that he actually recently just finished antibiotics from his PCP for an episode of diverticulitis.  He is here today with complaints of weight loss.  Has lost about 15 pounds over the past year unintentionally.  He tells me that he is becoming weak and fatigued and losing stamina.  He tells me that his PCP told him that he should be checked for esophageal, colon, stomach cancer as well as pancreatic problems.  He asks about issues with pancreatic enzymes.  He reports alternating constipation and diarrhea.  He reports intermittent lower abdominal pains.  Also says that he gets very full and bloated in has lower abdomen.  He tells me that he knows that there is something bad going on.  No upper GI complaints really including heartburn/reflux, dysphagia, etc.  He tells me that he has had multiple labs performed by his PCP, Dr. Joylene Draft.   Past Medical History:  Diagnosis Date  . Adenomatous polyp of colon 06/20/2003  . BPH (benign prostatic hypertrophy)   . DDD (degenerative disc disease)   . Diverticulosis   . ED (erectile dysfunction)   . Hyperlipidemia   . Hypertension   . Osteoarthritis    Past Surgical History:  Procedure Laterality Date  . CERVICAL FUSION  1997  . LUMBAR LAMINECTOMY    . REPLANTATION FINGER Right   . TONSILLECTOMY    . TOTAL HIP ARTHROPLASTY Right     reports that he has  never smoked. He has never used smokeless tobacco. He reports current alcohol use of about 2.0 standard drinks of alcohol per week. He reports that he does not use drugs. family history includes Alzheimer's disease in his mother; Cancer in his father; Diabetes in his maternal aunt and maternal uncle. Allergies  Allergen Reactions  . Statins       Outpatient Encounter Medications as of 08/14/2020  Medication Sig  . lisinopril (PRINIVIL,ZESTRIL) 10 MG tablet Take 10 mg by mouth daily.  Marland Kitchen REPATHA SURECLICK 086 MG/ML SOAJ Inject into the skin every 14 (fourteen) days.  . sildenafil (REVATIO) 20 MG tablet Take 20 mg by mouth daily as needed.   . [DISCONTINUED] ezetimibe-simvastatin (VYTORIN) 10-40 MG per tablet Take 1 tablet by mouth at bedtime.   No facility-administered encounter medications on file as of 08/14/2020.    REVIEW OF SYSTEMS  : All other systems reviewed and negative except where noted in the History of Present Illness.   PHYSICAL EXAM: BP (!) 150/100 (BP Location: Left Arm, Patient Position: Sitting, Cuff Size: Normal)   Pulse 64   Ht 5' 3.39" (1.61 m) Comment: height measured without shoes  Wt 151 lb 6 oz (68.7 kg)   BMI 26.49 kg/m  General: Well developed white male in no acute distress Head: Normocephalic and atraumatic Eyes:  Sclerae anicteric, conjunctiva pink. Ears: Normal auditory acuity Lungs: Clear throughout to auscultation; no W/R/R. Heart: Regular  rate and rhythm; no M/R/G. Abdomen: Soft, non-distended.  BS present.  Non-tender. Rectal:  Will be done at the time of colonoscopy. Musculoskeletal: Symmetrical with no gross deformities  Skin: No lesions on visible extremities Extremities: No edema  Neurological: Alert oriented x 4, grossly non-focal Psychological:  Alert and cooperative. Normal mood and affect  ASSESSMENT AND PLAN: *74 year old male with complaints of weight loss of about 15 pounds or so over the past year with decreasing energy and stamina  as well as lower abdominal pain/bloating and alternating bowel habits between constipation and loose stools.  He reports being told by his PCP that he needed to get tested for esophageal, colon, stomach problems and pancreatic problems.   *Personal history of colon polyps: Had tubular adenoma removed at the time of last colonoscopy in 2014.  5-year recall was recommended.  **We will plan for both EGD and colonoscopy.  He reports just completing antibiotics for diverticulitis via his PCP.  No imaging performed, however.  If he did in fact have diverticulitis then we should wait a few weeks before performing colonoscopy.  These are being scheduled Dr. Bryan Lemma as Dr. Fuller Plan did not have availability in a reasonable amount of time. **In the interim we will plan for a CT scan of the chest, abdomen, pelvis.  I hope that we can get the chest covered as well, but he has never been a smoker and really does not have any pulmonary symptoms.  If not then abdomen and pelvis will have to suffice for now. **We discussed checking pancreatic fecal elastase stool study, which may be necessary in the future, but we will await results of these upcoming studies first. **We will try to obtain all recent labs that he has had performed by his PCP.   CC:  Crist Infante, MD

## 2020-08-14 NOTE — Patient Instructions (Addendum)
If you are age 72 or older, your body mass index should be between 23-30. Your Body mass index is 26.49 kg/m. If this is out of the aforementioned range listed, please consider follow up with your Primary Care Provider.  If you are age 50 or younger, your body mass index should be between 19-25. Your Body mass index is 26.49 kg/m. If this is out of the aformentioned range listed, please consider follow up with your Primary Care Provider.   You have been scheduled for a colonoscopy. Please follow written instructions given to you at your visit today.  Please pick up your prep supplies at the pharmacy within the next 1-3 days. If you use inhalers (even only as needed), please bring them with you on the day of your procedure.  Due to recent changes in healthcare laws, you may see the results of your imaging and laboratory studies on MyChart before your provider has had a chance to review them.  We understand that in some cases there may be results that are confusing or concerning to you. Not all laboratory results come back in the same time frame and the provider may be waiting for multiple results in order to interpret others.  Please give [place] 48 hours in order for your provider to thoroughly review all the results before contacting the office for clarification of your results.   You have been scheduled for a CT scan of the chest, abdomen, and pelvis at [facility], 1st floor Radiology. You are scheduled on 08-25-20  at 7:30am. You should arrive 15 minutes prior to your appointment time for registration.  Please pick up 2 bottles of contrast from [employer] at least 3 days prior to your scan. The solution may taste better if refrigerated, but do NOT add ice or any other liquid to this solution. Shake well before drinking.   Please follow the written instructions below on the day of your exam:   1) Do not eat anything after 3:30am (4 hours prior to your test)   2) Drink 1 bottle of contrast @  5:30am (2 hours prior to your exam)  Remember to shake well before drinking and do NOT pour over ice.     Drink 1 bottle of contrast @ 6:30am (1 hour prior to your exam)   You may take any medications as prescribed with a small amount of water, if necessary. If you take any of the following medications: METFORMIN, GLUCOPHAGE, GLUCOVANCE, AVANDAMET, RIOMET, FORTAMET, [place] MET, JANUMET, GLUMETZA or METAGLIP, you MAY be asked to HOLD this medication 48 hours AFTER the exam.   The purpose of you drinking the oral contrast is to aid in the visualization of your intestinal tract. The contrast solution may cause some diarrhea. Depending on your individual set of symptoms, you may also receive an intravenous injection of x-ray contrast/dye. Plan on being at [facility] for 45 minutes or longer, depending on the type of exam you are having performed.   If you have any questions regarding your exam or if you need to reschedule, you may call [facility] at [phone] between the hours of 8:00 am and 5:00 pm, Monday-Friday.    We will obtain recent lab work from Dr Joylene Draft for our review.  _________________________________________________________  Thank you for entrusting me with your care and choosing [facility].  Alonza Bogus, PA-C

## 2020-08-14 NOTE — Progress Notes (Signed)
Reviewed and agree with management plan.  Savanna Dooley T. Orla Jolliff, MD FACG [facility]  

## 2020-08-18 NOTE — Progress Notes (Signed)
Agree with the assessment and plan as outlined by Jessica Zehr, PA-C. ? ?Kathalina Ostermann, DO, FACG ? ?

## 2020-08-19 DIAGNOSIS — Z961 Presence of intraocular lens: Secondary | ICD-10-CM | POA: Diagnosis not present

## 2020-08-19 DIAGNOSIS — H524 Presbyopia: Secondary | ICD-10-CM | POA: Diagnosis not present

## 2020-08-19 DIAGNOSIS — H26493 Other secondary cataract, bilateral: Secondary | ICD-10-CM | POA: Diagnosis not present

## 2020-08-25 ENCOUNTER — Other Ambulatory Visit: Payer: Self-pay

## 2020-08-25 ENCOUNTER — Encounter (HOSPITAL_COMMUNITY): Payer: Self-pay

## 2020-08-25 ENCOUNTER — Ambulatory Visit (HOSPITAL_COMMUNITY)
Admission: RE | Admit: 2020-08-25 | Discharge: 2020-08-25 | Disposition: A | Discharge status: Home / Self Care | Payer: PPO | Source: Ambulatory Visit | Attending: Gastroenterology | Admitting: Gastroenterology

## 2020-08-25 DIAGNOSIS — J8410 Pulmonary fibrosis, unspecified: Secondary | ICD-10-CM | POA: Diagnosis not present

## 2020-08-25 DIAGNOSIS — R194 Change in bowel habit: Secondary | ICD-10-CM | POA: Diagnosis not present

## 2020-08-25 DIAGNOSIS — R634 Abnormal weight loss: Secondary | ICD-10-CM

## 2020-08-25 DIAGNOSIS — R109 Unspecified abdominal pain: Secondary | ICD-10-CM | POA: Insufficient documentation

## 2020-08-25 DIAGNOSIS — N200 Calculus of kidney: Secondary | ICD-10-CM | POA: Diagnosis not present

## 2020-08-25 DIAGNOSIS — Z87438 Personal history of other diseases of male genital organs: Secondary | ICD-10-CM | POA: Diagnosis not present

## 2020-08-25 LAB — POCT I-STAT CREATININE: Creatinine, Ser: 0.7 mg/dL (ref 0.61–1.24)

## 2020-08-25 MED ORDER — IOHEXOL 300 MG/ML  SOLN
100.0000 mL | Freq: Once | INTRAMUSCULAR | Status: AC | PRN
  Administered 2020-08-25: 100 mL via INTRAVENOUS

## 2020-08-26 ENCOUNTER — Telehealth: Payer: Self-pay | Admitting: General Surgery

## 2020-08-26 NOTE — Telephone Encounter (Signed)
Left a detailed message on the patients answering machine that the CT was normal for a GI stand point. Patient was asked to contact the office to explain in detail.

## 2020-08-26 NOTE — Telephone Encounter (Signed)
-----   Message from [place], DO sent at 08/26/2020  4:50 PM EST ----- CT with normal-appearing lungs, liver, pancreas.  Aside from diverticulosis, normal GI tract.  No lymphadenopathy (lymph node enlargement).    There was left adrenal gland thickening with nodularity, kidney cysts, and a left kidney stone, along with mild prostate enlargement. I recommend follow-up with PCM for each of these findings, but otherwise no source for weight loss from evaluation of the GI tract.Marland Kitchen

## 2020-09-17 DIAGNOSIS — H26492 Other secondary cataract, left eye: Secondary | ICD-10-CM | POA: Diagnosis not present

## 2020-09-22 ENCOUNTER — Other Ambulatory Visit: Payer: Self-pay

## 2020-09-22 ENCOUNTER — Ambulatory Visit (AMBULATORY_SURGERY_CENTER): Payer: PPO | Admitting: Gastroenterology

## 2020-09-22 ENCOUNTER — Encounter: Payer: Self-pay | Admitting: Gastroenterology

## 2020-09-22 VITALS — BP 145/81 | HR 50 | Temp 97.5°F | Resp 28 | Ht 63.0 in | Wt 151.0 lb

## 2020-09-22 DIAGNOSIS — K2950 Unspecified chronic gastritis without bleeding: Secondary | ICD-10-CM | POA: Diagnosis not present

## 2020-09-22 DIAGNOSIS — K2970 Gastritis, unspecified, without bleeding: Secondary | ICD-10-CM | POA: Diagnosis not present

## 2020-09-22 DIAGNOSIS — K2990 Gastroduodenitis, unspecified, without bleeding: Secondary | ICD-10-CM

## 2020-09-22 DIAGNOSIS — K2980 Duodenitis without bleeding: Secondary | ICD-10-CM | POA: Diagnosis not present

## 2020-09-22 DIAGNOSIS — K2289 Other specified disease of esophagus: Secondary | ICD-10-CM | POA: Diagnosis not present

## 2020-09-22 DIAGNOSIS — Z86010 Personal history of colonic polyps: Secondary | ICD-10-CM

## 2020-09-22 DIAGNOSIS — K2100 Gastro-esophageal reflux disease with esophagitis, without bleeding: Secondary | ICD-10-CM | POA: Diagnosis not present

## 2020-09-22 DIAGNOSIS — K5730 Diverticulosis of large intestine without perforation or abscess without bleeding: Secondary | ICD-10-CM | POA: Diagnosis not present

## 2020-09-22 DIAGNOSIS — K319 Disease of stomach and duodenum, unspecified: Secondary | ICD-10-CM | POA: Diagnosis not present

## 2020-09-22 DIAGNOSIS — R634 Abnormal weight loss: Secondary | ICD-10-CM | POA: Diagnosis not present

## 2020-09-22 DIAGNOSIS — D124 Benign neoplasm of descending colon: Secondary | ICD-10-CM | POA: Diagnosis not present

## 2020-09-22 DIAGNOSIS — K641 Second degree hemorrhoids: Secondary | ICD-10-CM

## 2020-09-22 DIAGNOSIS — K259 Gastric ulcer, unspecified as acute or chronic, without hemorrhage or perforation: Secondary | ICD-10-CM | POA: Diagnosis not present

## 2020-09-22 DIAGNOSIS — I10 Essential (primary) hypertension: Secondary | ICD-10-CM | POA: Diagnosis not present

## 2020-09-22 DIAGNOSIS — K449 Diaphragmatic hernia without obstruction or gangrene: Secondary | ICD-10-CM

## 2020-09-22 DIAGNOSIS — R194 Change in bowel habit: Secondary | ICD-10-CM

## 2020-09-22 DIAGNOSIS — K208 Other esophagitis without bleeding: Secondary | ICD-10-CM | POA: Diagnosis not present

## 2020-09-22 MED ORDER — PANTOPRAZOLE SODIUM 40 MG PO TBEC: 40.0000 mg | DELAYED_RELEASE_TABLET | Freq: Two times a day (BID) | ORAL | 3 refills | Status: DC

## 2020-09-22 MED ORDER — SODIUM CHLORIDE 0.9 % IV SOLN: 500.0000 mL | Freq: Once | INTRAVENOUS | Status: DC

## 2020-09-22 NOTE — Progress Notes (Signed)
Report to PACU, RN, vss, BBS= Clear.  

## 2020-09-22 NOTE — Op Note (Signed)
[name] Procedure Date: 09/22/2020 1:56 PM MRN: [id number] Endoscopist: Gerrit Heck , MD Age: 74 Referring MD:  Date of Birth: May 07, 1946 Gender: Male Account #: [id number] Procedure:                Colonoscopy Indications:              Lower abdominal pain, Change in bowel habits,                            Weight loss Medicines:                Monitored Anesthesia Care Procedure:                Pre-Anesthesia Assessment:                           - Prior to the procedure, a History and Physical                            was performed, and patient medications and                            allergies were reviewed. The patient's tolerance of                            previous anesthesia was also reviewed. The risks                            and benefits of the procedure and the sedation                            options and risks were discussed with the patient.                            All questions were answered, and informed consent                            was obtained. Prior Anticoagulants: The patient has                            taken no previous anticoagulant or antiplatelet                            agents. ASA Grade Assessment: II - A patient with                            mild systemic disease. After reviewing the risks                            and benefits, the patient was deemed in                            satisfactory condition to undergo the procedure.  After obtaining informed consent, the colonoscope                            was passed under direct vision. Throughout the                            procedure, the patient's blood pressure, pulse, and                            oxygen saturations were monitored continuously. The                            Colonoscope was introduced through the anus and                            advanced to the the terminal ileum. The colonoscopy                             was performed without difficulty. The patient                            tolerated the procedure well. The quality of the                            bowel preparation was good. The terminal ileum,                            ileocecal valve, appendiceal orifice, and rectum                            were photographed. Scope In: 2:23:06 PM Scope Out: 2:36:26 PM Scope Withdrawal Time: 0 hours 11 minutes 35 seconds  Total Procedure Duration: 0 hours 13 minutes 20 seconds  Findings:                 The perianal and digital rectal examinations were                            normal.                           A 5 mm polyp was found in the descending colon. The                            polyp was sessile. The polyp was removed with a                            cold snare. Resection and retrieval were complete.                            Estimated blood loss was minimal.                           Multiple small and large-mouthed diverticula were  found in the sigmoid colon. There was a localized                            area of inflammation (erythema) in the sigmoid                            colon, located 25-35 cm from the anal verge. This                            was located in an area of dense diverticulosis.                            Biopsies were taken with a cold forceps for                            histology. Estimated blood loss was minimal.                           Normal mucosa was found in the descending colon, in                            the transverse colon, in the ascending colon and in                            the cecum. Biopsies for histology were taken with a                            cold forceps from the right colon and left colon                            for evaluation of microscopic colitis. Estimated                            blood loss was minimal.                           Non-bleeding internal  hemorrhoids were found during                            retroflexion. The hemorrhoids were medium-sized.                           The terminal ileum appeared normal. Complications:            No immediate complications. Estimated Blood Loss:     Estimated blood loss was minimal. Impression:               - One 5 mm polyp in the descending colon, removed                            with a cold snare. Resected and retrieved.                           - Diverticulosis in  the sigmoid colon.                           - There was a localized area of inflammation                            (erythema) in the sigmoid colon, located 25-35 cm                            from the anal verge. This was located in an area of                            dense diverticulosis. Biopsied.                           - Normal mucosa in the descending colon, in the                            transverse colon, in the ascending colon and in the                            cecum. Biopsied.                           - Non-bleeding internal hemorrhoids.                           - The examined portion of the ileum was normal. Recommendation:           - Patient has a contact number available for                            emergencies. The signs and symptoms of potential                            delayed complications were discussed with the                            patient. Return to normal activities tomorrow.                            Written discharge instructions were provided to the                            patient.                           - Resume previous diet.                           - Continue present medications.                           - Await pathology results.                           -  Repeat colonoscopy for surveillance based on                            pathology results.                           - Return to GI office at appointment to be                            scheduled. Gerrit Heck, MD 09/22/2020 3:03:48 PM

## 2020-09-22 NOTE — Progress Notes (Signed)
Medical history reviewed with no changes noted. VS assessed by J.D 

## 2020-09-22 NOTE — Progress Notes (Signed)
Called to room to assist during endoscopic procedure.  Patient ID and intended procedure confirmed with present staff. Received instructions for my participation in the procedure from the performing physician.  

## 2020-09-22 NOTE — Op Note (Signed)
[facility] Patient Name: Frank Vance Procedure Date: 09/22/2020 1:56 PM MRN: [id number] Endoscopist: Gerrit Heck , MD Age: 74 Referring MD:  Date of Birth: November 24, 1945 Gender: Male Account #: [id number] Procedure:                Upper GI endoscopy Indications:              Lower abdominal pain, Abdominal bloating,                            Diarrhea/Change in bowel habits, Weight loss Medicines:                Monitored Anesthesia Care Procedure:                Pre-Anesthesia Assessment:                           - Prior to the procedure, a History and Physical                            was performed, and patient medications and                            allergies were reviewed. The patient's tolerance of                            previous anesthesia was also reviewed. The risks                            and benefits of the procedure and the sedation                            options and risks were discussed with the patient.                            All questions were answered, and informed consent                            was obtained. Prior Anticoagulants: The patient has                            taken no previous anticoagulant or antiplatelet                            agents. ASA Grade Assessment: II - A patient with                            mild systemic disease. After reviewing the risks                            and benefits, the patient was deemed in                            satisfactory condition to undergo the procedure.  After obtaining informed consent, the endoscope was                            passed under direct vision. Throughout the                            procedure, the patient's blood pressure, pulse, and                            oxygen saturations were monitored continuously. The                            Endoscope was introduced through the mouth, and                            advanced to the  second part of duodenum. The upper                            GI endoscopy was accomplished without difficulty.                            The patient tolerated the procedure well. Scope In: Scope Out: Findings:                 Esophagogastric landmarks were identified: the                            gastroesophageal junction was found at 39 cm and                            the site of hiatal narrowing was found at 41 cm                            from the incisors.                           A 2 cm hiatal hernia was present.                           Circumferential salmon-colored mucosa was present                            from 37 to 39 cm. Single area of nodularity was                            present at 39 cm. The maximum longitudinal extent                            of these esophageal mucosal changes was 2 cm in                            length. Biopsies were first taken from the area of  nodularity, then taken in a 4-quadrants at 39 cm                            and 37 cm for histology. Estimated blood loss was                            minimal.                           A single area of ectopic gastric mucosa was found                            in the upper third of the esophagus, 20 cm from the                            incisors. Biopsies were taken with a cold forceps                            for histology. Estimated blood loss was minimal.                           LA Grade D (one or more mucosal breaks involving at                            least 75% of esophageal circumference) esophagitis                            with no bleeding was found 31 cm from the incisors.                           Diffuse moderate inflammation characterized by                            congestion (edema) and erythema was found in the                            gastric body and in the gastric antrum. Biopsies                            were taken with a cold  forceps for Helicobacter                            pylori testing. Estimated blood loss was minimal.                           Localized moderate inflammation characterized by                            congestion (edema), erosions and erythema was found                            in the duodenal bulb. Biopsies were taken with a  cold forceps for histology. Estimated blood loss                            was minimal.                           The second portion of the duodenum was normal.                            Biopsies for histology were taken with a cold                            forceps for evaluation of celiac disease. Estimated                            blood loss was minimal. Complications:            No immediate complications. Estimated Blood Loss:     Estimated blood loss was minimal. Impression:               - Esophagogastric landmarks identified.                           - 2 cm hiatal hernia.                           - Salmon-colored mucosa suspicious for                            short-segment Barrett's esophagus. Biopsied.                           - Ectopic gastric mucosa in the upper third of the                            esophagus. Biopsied.                           - LA Grade D reflux esophagitis with no bleeding.                           - Gastritis. Biopsied.                           - Duodenitis. Biopsied.                           - Normal second portion of the duodenum. Biopsied. Recommendation:           - Patient has a contact number available for                            emergencies. The signs and symptoms of potential                            delayed complications were discussed with the  patient. Return to normal activities tomorrow.                            Written discharge instructions were provided to the                            patient.                           - Resume previous  diet today.                           - Continue present medications.                           - Await pathology results.                           - Use Protonix (pantoprazole) 40 mg PO BID for 8                            weeks to promote mucosal healing, then reduce to 20                            mg BID for ongoing control of reflux esophagitis.                           - Perform a colonoscopy today. Gerrit Heck, MD 09/22/2020 2:58:23 PM

## 2020-09-22 NOTE — Patient Instructions (Signed)
Await pathology  Please read over handouts about polyps,hemorrhoids, diverticulosis, gastritis, hiatal hernias and high fiber diets  Continue your normal medications  Start Protonix (Pantoprazole) 40 mg twice daily- take 1 tablet 30 minutes before breakfast and dinner for 8 weeks.  Then reduce to 20 mg twice daily for ongoing reflux  Continue your normal medications  Repeat colonoscopy based on pathology results; contact Dr. Bryan Lemma if needed for an office appointment   YOU HAD AN ENDOSCOPIC PROCEDURE TODAY AT [facility]:   Refer to the procedure report that was given to you for any specific questions about what was found during the examination.  If the procedure report does not answer your questions, please call your gastroenterologist to clarify.  If you requested that your care partner not be given the details of your procedure findings, then the procedure report has been included in a sealed envelope for you to review at your convenience later.  YOU SHOULD EXPECT: Some feelings of bloating in the abdomen. Passage of more gas than usual.  Walking can help get rid of the air that was put into your GI tract during the procedure and reduce the bloating. If you had a lower endoscopy (such as a colonoscopy or flexible sigmoidoscopy) you may notice spotting of blood in your stool or on the toilet paper. If you underwent a bowel prep for your procedure, you may not have a normal bowel movement for a few days.  Please Note:  You might notice some irritation and congestion in your nose or some drainage.  This is from the oxygen used during your procedure.  There is no need for concern and it should clear up in a day or so.  SYMPTOMS TO REPORT IMMEDIATELY:   Following lower endoscopy (colonoscopy or flexible sigmoidoscopy):  Excessive amounts of blood in the stool  Significant tenderness or worsening of abdominal pains  Swelling of the abdomen that is new, acute  Fever of 100F  or higher   Following upper endoscopy (EGD)  Vomiting of blood or coffee ground material  New chest pain or pain under the shoulder blades  Painful or persistently difficult swallowing  New shortness of breath  Fever of 100F or higher  Black, tarry-looking stools  For urgent or emergent issues, a gastroenterologist can be reached at any hour by calling [phone]. Do not use MyChart messaging for urgent concerns.    DIET:  We do recommend a small meal at first, but then you may proceed to your regular diet.  Drink plenty of fluids but you should avoid alcoholic beverages for 24 hours.  ACTIVITY:  You should plan to take it easy for the rest of today and you should NOT DRIVE or use heavy machinery until tomorrow (because of the sedation medicines used during the test).    FOLLOW UP: Our staff will call the number listed on your records 48-72 hours following your procedure to check on you and address any questions or concerns that you may have regarding the information given to you following your procedure. If we do not reach you, we will leave a message.  We will attempt to reach you two times.  During this call, we will ask if you have developed any symptoms of COVID 19. If you develop any symptoms (ie: fever, flu-like symptoms, shortness of breath, cough etc.) before then, please call [phone].  If you test positive for Covid 19 in the 2 weeks post procedure, please call and report this information  to [place].    If any biopsies were taken you will be contacted by phone or by letter within the next 1-3 weeks.  Please call us at [phone] if you have not heard about the biopsies in 3 weeks.    SIGNATURES/CONFIDENTIALITY: You and/or your care partner have signed paperwork which will be entered into your electronic medical record.  These signatures attest to the fact that that the information above on your After Visit Summary has been reviewed and is understood.  Full responsibility  of the confidentiality of this discharge information lies with you and/or your care-partner.

## 2020-09-24 ENCOUNTER — Telehealth: Payer: Self-pay | Admitting: *Deleted

## 2020-09-24 NOTE — Telephone Encounter (Signed)
°  Follow up Call-  Call back number 09/22/2020  Post procedure Call Back phone  # [phone]  Permission to leave phone message Yes  Some recent data might be hidden     Patient questions:  Do you have a fever, pain , or abdominal swelling? No. Pain Score  0 *  Have you tolerated food without any problems? Yes.    Have you been able to return to your normal activities? Yes.    Do you have any questions about your discharge instructions: Diet   No. Medications  No. Follow up visit  No.  Do you have questions or concerns about your Care? No.  Actions: * If pain score is 4 or above: No action needed, pain <4.  1. Have you developed a fever since your procedure? no  2.   Have you had an respiratory symptoms (SOB or cough) since your procedure? no  3.   Have you tested positive for COVID 19 since your procedure no  4.   Have you had any family members/close contacts diagnosed with the COVID 19 since your procedure? no   If yes to any of these questions please route to Joylene John, RN and Joella Prince, RN

## 2020-09-29 ENCOUNTER — Encounter: Payer: Self-pay | Admitting: Gastroenterology

## 2020-10-02 ENCOUNTER — Telehealth: Payer: Self-pay | Admitting: General Surgery

## 2020-10-02 NOTE — Telephone Encounter (Signed)
Left a detailed message on the patients voicemail regarding his pathology. Explained we would like him to contact the office to schedule a follow up for 6 weeks.

## 2020-10-02 NOTE — Telephone Encounter (Signed)
-----   Message from Lavena Bullion, DO sent at 09/29/2020  8:02 AM EST ----- The results from the recent EGD/Colonoscopy are as follows: EGD: - The biopsies taken from your small intestine were normal and there was no evidence of Celiac Disease.  - The biopsies taken from your stomach and duodenal bulb were notable for gastritis and peptic duodenitis (inflammation), but there was no evidence of Helicobacter pylori infection.  - The biopsies taken from the esophagus show reflux changes, but no evidence of Intestinal Metaplasia (Barrett's Esophagus).  - The nodule in the esophagus similarly showed inflammatory and hyperplastic changes, but no Barrett's Esophagus or dysplasia  Colonoscopy: - The biopsies taken from your colon were normal and there was no evidence of Microscopic Colitis or chronic inflammatory changes.  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

## 2020-10-09 ENCOUNTER — Encounter: Payer: Self-pay | Admitting: General Surgery

## 2020-12-18 DIAGNOSIS — H903 Sensorineural hearing loss, bilateral: Secondary | ICD-10-CM | POA: Diagnosis not present

## 2021-01-06 DIAGNOSIS — H9313 Tinnitus, bilateral: Secondary | ICD-10-CM | POA: Diagnosis not present

## 2021-01-06 DIAGNOSIS — H903 Sensorineural hearing loss, bilateral: Secondary | ICD-10-CM | POA: Diagnosis not present

## 2021-01-06 DIAGNOSIS — H6121 Impacted cerumen, right ear: Secondary | ICD-10-CM | POA: Diagnosis not present

## 2021-02-23 DIAGNOSIS — Z96641 Presence of right artificial hip joint: Secondary | ICD-10-CM | POA: Diagnosis not present

## 2021-03-12 DIAGNOSIS — E785 Hyperlipidemia, unspecified: Secondary | ICD-10-CM | POA: Diagnosis not present

## 2021-03-12 DIAGNOSIS — M810 Age-related osteoporosis without current pathological fracture: Secondary | ICD-10-CM | POA: Diagnosis not present

## 2021-03-12 DIAGNOSIS — Z125 Encounter for screening for malignant neoplasm of prostate: Secondary | ICD-10-CM | POA: Diagnosis not present

## 2021-03-12 DIAGNOSIS — R7301 Impaired fasting glucose: Secondary | ICD-10-CM | POA: Diagnosis not present

## 2021-03-12 DIAGNOSIS — Z0000 Encounter for general adult medical examination without abnormal findings: Secondary | ICD-10-CM | POA: Diagnosis not present

## 2021-03-12 DIAGNOSIS — R82998 Other abnormal findings in urine: Secondary | ICD-10-CM | POA: Diagnosis not present

## 2021-03-12 DIAGNOSIS — E291 Testicular hypofunction: Secondary | ICD-10-CM | POA: Diagnosis not present

## 2021-03-26 DIAGNOSIS — Z1331 Encounter for screening for depression: Secondary | ICD-10-CM | POA: Diagnosis not present

## 2021-03-26 DIAGNOSIS — I10 Essential (primary) hypertension: Secondary | ICD-10-CM | POA: Diagnosis not present

## 2021-03-26 DIAGNOSIS — M48062 Spinal stenosis, lumbar region with neurogenic claudication: Secondary | ICD-10-CM | POA: Diagnosis not present

## 2021-03-26 DIAGNOSIS — E785 Hyperlipidemia, unspecified: Secondary | ICD-10-CM | POA: Diagnosis not present

## 2021-03-26 DIAGNOSIS — M810 Age-related osteoporosis without current pathological fracture: Secondary | ICD-10-CM | POA: Diagnosis not present

## 2021-03-26 DIAGNOSIS — I2510 Atherosclerotic heart disease of native coronary artery without angina pectoris: Secondary | ICD-10-CM | POA: Diagnosis not present

## 2021-03-26 DIAGNOSIS — I77810 Thoracic aortic ectasia: Secondary | ICD-10-CM | POA: Diagnosis not present

## 2021-03-26 DIAGNOSIS — N401 Enlarged prostate with lower urinary tract symptoms: Secondary | ICD-10-CM | POA: Diagnosis not present

## 2021-03-26 DIAGNOSIS — F329 Major depressive disorder, single episode, unspecified: Secondary | ICD-10-CM | POA: Diagnosis not present

## 2021-03-26 DIAGNOSIS — R7301 Impaired fasting glucose: Secondary | ICD-10-CM | POA: Diagnosis not present

## 2021-03-26 DIAGNOSIS — H7409 Tympanosclerosis, unspecified ear: Secondary | ICD-10-CM | POA: Diagnosis not present

## 2021-03-26 DIAGNOSIS — Z0000 Encounter for general adult medical examination without abnormal findings: Secondary | ICD-10-CM | POA: Diagnosis not present

## 2021-04-14 ENCOUNTER — Other Ambulatory Visit: Payer: Self-pay | Admitting: Gastroenterology

## 2021-04-14 DIAGNOSIS — R634 Abnormal weight loss: Secondary | ICD-10-CM

## 2021-06-22 DIAGNOSIS — H26491 Other secondary cataract, right eye: Secondary | ICD-10-CM | POA: Diagnosis not present

## 2021-06-22 DIAGNOSIS — Z961 Presence of intraocular lens: Secondary | ICD-10-CM | POA: Diagnosis not present

## 2021-07-01 DIAGNOSIS — H26491 Other secondary cataract, right eye: Secondary | ICD-10-CM | POA: Diagnosis not present

## 2021-08-13 DIAGNOSIS — R634 Abnormal weight loss: Secondary | ICD-10-CM | POA: Diagnosis not present

## 2021-08-13 DIAGNOSIS — E785 Hyperlipidemia, unspecified: Secondary | ICD-10-CM | POA: Diagnosis not present

## 2021-08-13 DIAGNOSIS — I10 Essential (primary) hypertension: Secondary | ICD-10-CM | POA: Diagnosis not present

## 2021-08-20 DIAGNOSIS — K8689 Other specified diseases of pancreas: Secondary | ICD-10-CM | POA: Diagnosis not present

## 2021-08-20 DIAGNOSIS — R7301 Impaired fasting glucose: Secondary | ICD-10-CM | POA: Diagnosis not present

## 2021-08-20 DIAGNOSIS — R634 Abnormal weight loss: Secondary | ICD-10-CM | POA: Diagnosis not present

## 2021-08-20 DIAGNOSIS — E785 Hyperlipidemia, unspecified: Secondary | ICD-10-CM | POA: Diagnosis not present

## 2021-08-20 DIAGNOSIS — I10 Essential (primary) hypertension: Secondary | ICD-10-CM | POA: Diagnosis not present

## 2021-08-20 DIAGNOSIS — N3281 Overactive bladder: Secondary | ICD-10-CM | POA: Diagnosis not present

## 2021-08-21 ENCOUNTER — Other Ambulatory Visit: Payer: Self-pay | Admitting: Internal Medicine

## 2021-08-21 DIAGNOSIS — R059 Cough, unspecified: Secondary | ICD-10-CM

## 2021-08-21 DIAGNOSIS — R634 Abnormal weight loss: Secondary | ICD-10-CM

## 2021-08-25 ENCOUNTER — Ambulatory Visit
Admission: RE | Admit: 2021-08-25 | Discharge: 2021-08-25 | Disposition: A | Discharge status: Home / Self Care | Payer: PPO | Source: Ambulatory Visit | Attending: Internal Medicine | Admitting: Internal Medicine

## 2021-08-25 DIAGNOSIS — N400 Enlarged prostate without lower urinary tract symptoms: Secondary | ICD-10-CM | POA: Diagnosis not present

## 2021-08-25 DIAGNOSIS — I700 Atherosclerosis of aorta: Secondary | ICD-10-CM | POA: Diagnosis not present

## 2021-08-25 DIAGNOSIS — R634 Abnormal weight loss: Secondary | ICD-10-CM

## 2021-08-25 DIAGNOSIS — K5730 Diverticulosis of large intestine without perforation or abscess without bleeding: Secondary | ICD-10-CM | POA: Diagnosis not present

## 2021-08-25 DIAGNOSIS — R059 Cough, unspecified: Secondary | ICD-10-CM

## 2021-08-25 DIAGNOSIS — I2510 Atherosclerotic heart disease of native coronary artery without angina pectoris: Secondary | ICD-10-CM | POA: Diagnosis not present

## 2021-08-25 MED ORDER — IOPAMIDOL (ISOVUE-300) INJECTION 61%
100.0000 mL | Freq: Once | INTRAVENOUS | Status: AC | PRN
  Administered 2021-08-25: 100 mL via INTRAVENOUS

## 2021-08-27 ENCOUNTER — Other Ambulatory Visit: Payer: Self-pay | Admitting: Internal Medicine

## 2021-08-27 DIAGNOSIS — J984 Other disorders of lung: Secondary | ICD-10-CM

## 2021-09-09 DIAGNOSIS — M6281 Muscle weakness (generalized): Secondary | ICD-10-CM | POA: Diagnosis not present

## 2021-09-09 DIAGNOSIS — J849 Interstitial pulmonary disease, unspecified: Secondary | ICD-10-CM | POA: Diagnosis not present

## 2021-09-09 DIAGNOSIS — I10 Essential (primary) hypertension: Secondary | ICD-10-CM | POA: Diagnosis not present

## 2021-09-09 DIAGNOSIS — R634 Abnormal weight loss: Secondary | ICD-10-CM | POA: Diagnosis not present

## 2021-09-17 ENCOUNTER — Ambulatory Visit: Payer: PPO | Admitting: Neurology

## 2021-09-17 ENCOUNTER — Other Ambulatory Visit: Payer: Self-pay

## 2021-09-17 ENCOUNTER — Encounter: Payer: Self-pay | Admitting: Neurology

## 2021-09-17 VITALS — BP 132/85 | HR 72 | Ht 65.0 in | Wt 138.8 lb

## 2021-09-17 DIAGNOSIS — W57XXXS Bitten or stung by nonvenomous insect and other nonvenomous arthropods, sequela: Secondary | ICD-10-CM | POA: Diagnosis not present

## 2021-09-17 DIAGNOSIS — M6250 Muscle wasting and atrophy, not elsewhere classified, unspecified site: Secondary | ICD-10-CM | POA: Diagnosis not present

## 2021-09-17 DIAGNOSIS — R634 Abnormal weight loss: Secondary | ICD-10-CM

## 2021-09-17 DIAGNOSIS — R531 Weakness: Secondary | ICD-10-CM

## 2021-09-17 NOTE — Patient Instructions (Addendum)
It was nice to meet you and Frank Vance today.  As discussed, I do not see any focal findings on neurological exam, your strength is actually quite good.  Nevertheless, we will do additional testing from my end of things:  We will do an EMG and nerve conduction velocity test, which is an electrical nerve and muscle test, which we will schedule. We will call you with the results. We will check blood work today and call you with the test results. So long as your test results are normal, we will call you with the results to keep you updated and we can follow-up in this clinic on an as-needed basis.

## 2021-09-17 NOTE — Progress Notes (Signed)
Subjective:    Patient ID: Frank Vance is a 75 y.o. male. y.o. male.  HPI    Frank Age, MD, PhD [facility] [address] [place], [place] [address]  Dear Dr. Joylene Vance,   I saw your patient, Frank Vance, upon your kind request in my neurologic clinic today for initial consultation of his weakness.  The patient is accompanied by his daughter, Frank Vance, today.  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  He reports that he has noticed weight loss despite eating properly.  He has been a very strong person all his life and has been a Product manager, he feels that he had to cut back on his exercise and weightlifting to avoid any further weight loss.  He felt that the more he exercised in the past couple of years the more he lost weight.  He feels weaker all over, he does not have any lateralizing symptoms, upper body seems to be just as weak as lower body, no specific focal weakness or atrophy or fasciculations noted.  He has chronic low back pain and chronic mid back pain due to scoliosis, he has not seen a spine specialist in years.  He had surgery by the same surgeon to the upper spine and lower spine.  He has not seen endocrinology but had seen GI, had upper and lower GI endoscopies.  He had some polyps removed but no cancer was found.  He tries to hydrate well with water.  He drinks caffeine in the form of coffee, 1 cup in the morning, alcohol 2-3 times a week, no liquor.   He is widowed and lives alone, he  has 1 son and 1 daughter.  He stays very active, works on a farm.  He has no family history of ALS or myasthenia gravis or neuropathy, nobody with unexplained weakness or wheelchair-bound state.   I reviewed your office note from 09/09/2021.  He has had evaluation with GI and has been referred to endocrinology for unexplained weight loss.  He has had imaging tests.  He had blood work through your office on 08/13/2021 and I reviewed the results, CBC with differential showed benign results, neutrophil count was slightly elevated, prealbumin was within normal range at 23, CMP showed glucose of 111, total bilirubin 1.6, otherwise creatinine 0.8, BUN 17, AST and ALT as well as alk phos normal, TSH normal at 1.64, testosterone 631, free testosterone 8.8, A1c 5.7, he had a recent CT scan of the chest, abdomen and pelvis with and without contrast on 08/25/2021 and I reviewed the results: advanced degenerative changes. No acute bony findings.   IMPRESSION: 1. No acute abdominal/pelvic findings, mass lesions or lymphadenopathy. 2. Stable appearing/chronic peripheral interstitial changes most notably at the lung apices. Follow-up high-resolution chest CT may be helpful for further evaluation and follow-up. 3. Stable advanced atherosclerotic calcifications involving the abdominal aorta and iliac arteries. 4. Stable enlarged prostate gland. 5. Stable small adrenal gland nodules, likely benign adenomas. 6. Advanced sigmoid colon diverticulosis without findings for acute diverticulitis. 7. Aortic atherosclerosis.   Aortic Atherosclerosis (ICD10-I70.0).  He is scheduled for a  high-res chest CT next week.  He has no history of headaches.  He has hearing loss and has bilateral hearing aids. He is wondering if he could have Lyme disease, he reports tick bites in the past, is wondering if we could check for Lyme disease.  His Past Medical History Is Significant For: Past Medical History:  Diagnosis Date    Adenomatous polyp of colon 06/20/2003   BPH (benign prostatic hypertrophy)    DDD (degenerative disc disease)    Diverticulosis    ED (erectile dysfunction)    Hyperlipidemia    Hypertension    Osteoarthritis     His Past Surgical History Is Significant For: Past Surgical History:  Procedure Laterality Date   CERVICAL FUSION  1997   LUMBAR LAMINECTOMY     REPLANTATION FINGER Right    TONSILLECTOMY     TOTAL HIP ARTHROPLASTY Right     His Family History Is Significant For: Family History  Problem Relation Vance of Onset   Alzheimer's disease Mother    Cancer Father        bile duct   Diabetes Maternal Uncle    Diabetes Maternal Aunt    Colon cancer Neg Hx    Esophageal cancer Neg Hx    Rectal cancer Neg Hx    Stomach cancer Neg Hx     His Social History Is Significant For: Social History   Socioeconomic History   Marital status: Widowed    Spouse name: Not on file   Number of children: 2   Years of education: Not on file   Highest education level: Not on file  Occupational History   Occupation: ATTORNEY  Tobacco Use   Smoking status: Never   Smokeless tobacco: Never  Vaping Use   Vaping Use: Never used  Substance and Sexual Activity   Alcohol use: Not Currently    Alcohol/week: 2.0 standard drinks    Types: 2 Cans of beer per week    Comment: < 1 per day   Drug use: No   Sexual activity: Not on file  Other Topics Concern   Not on file  Social History Narrative   Not on file   Social Determinants of Health   Financial Resource Strain: Not on file  Food Insecurity: Not on file  Transportation Needs: Not on file  Physical Activity: Not on file  Stress: Not on file  Social Connections: Not on file    His Allergies Are:  Allergies  Allergen Reactions   Statins   :   His Current Medications Are:  Outpatient Encounter Medications as of 09/17/2021  Medication Sig   lisinopril (PRINIVIL,ZESTRIL) 10 MG tablet Take 10 mg by mouth daily.   pantoprazole  (PROTONIX) 40 MG tablet Take 1 tablet (40 mg total) by mouth daily.   REPATHA SURECLICK 294 MG/ML SOAJ Inject into the skin every 14 (fourteen) days.   sildenafil (REVATIO) 20 MG tablet Take 20 mg by mouth daily as needed.    No facility-administered encounter medications on file as of 09/17/2021.  :   Review of Systems:  Out of a complete 14 point review of systems, all are reviewed and negative with the exception of these symptoms as listed below:  Review of Systems  Neurological:        Pt states he has lost 30 lbs in 2 years. Pt states he is weak.Pt states is losing muscle . Pt states he has numbness in fingertips. Pt states he think he has lyme disease  because he farms in [place]. Pt states PCP wanted him to get tested for ALS. Pt states he has difficultly performing his ADL.    Objective:  Neurological Exam  Physical Exam Physical Examination:   Vitals:   09/17/21 1050  BP: 132/85  Pulse: 72    General Examination: The patient is a very pleasant 75 y.o. male in no acute distress. He appears well-developed and well-nourished and well groomed.   HEENT: Normocephalic, atraumatic, pupils are equal, round and reactive to light, extraocular tracking is good without limitation to gaze excursion or nystagmus noted. Hearing is grossly intact with bilateral hearing aids in place.  Face is symmetric with normal facial animation and normal facial sensation to temperature and vibration. Speech is clear with no dysarthria noted. There is no hypophonia. There is no lip, neck/head, jaw or voice tremor. Neck is supple with full range of passive and active motion. There are no carotid bruits on auscultation. Oropharynx exam reveals: mild mouth dryness, adequate dental hygiene. Tongue protrudes centrally and palate elevates symmetrically.   Chest: Clear to auscultation without wheezing, rhonchi or crackles noted.  Heart: S1+S2+0, regular and normal without murmurs, rubs or gallops noted.   Abdomen:  Soft, non-tender and non-distended with normal bowel sounds appreciated on auscultation.  Extremities: There is no pitting edema in the distal lower extremities bilaterally.   Skin: Warm and dry without trophic changes noted.   Musculoskeletal: exam reveals no obvious joint deformities, tenderness or joint swelling or erythema.   Neurologically:  Mental status: The patient is awake, alert and oriented in all 4 spheres. His immediate and remote memory, attention, language skills and fund of knowledge are appropriate. There is no evidence of aphasia, agnosia, apraxia or anomia. Speech is clear with normal prosody and enunciation. Thought process is linear. Mood is normal and affect is normal.  Cranial nerves II - XII are as described above under HEENT exam.  Motor exam: Thin muscle bulk, no focal atrophy noted, strength overall 5 out of 5, perhaps milder hip flexor weakness bilaterally.  No obvious fasciculations, reflexes are 1+ in the upper extremities and diminished in the lower extremities, possibly trace in the knees but absent in the ankles, toes are downgoing bilaterally.  Fine motor skills and coordination: grossly intact with finger taps, hand movements and foot taps bilaterally, symmetrical.  Cerebellar testing: No dysmetria or intention tremor. There is no truncal or gait ataxia.  Finger-to-nose and heel-to-shin unremarkable. Sensory exam: intact to light touch, temperature and vibration sense in the upper and lower extremities.  Gait, station and balance: He stands without difficulty, posture shows mild scoliosis, right shoulder is a little bit higher than left and otherwise mild increase in upper back kyphosis.  He walks with normal stride length and pace, no shuffling, has preserved arm swing bilaterally.  No walking aid.  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  He reports a history of weight loss in the past 2 years without any significant appetite loss.  He reports that he worked with physical therapy and nutritionist  and has seen GI.  He had a CT scan recently of the abdomen, pelvis and chest.  Examination from the neurological standpoint shows no obvious weakness, no obvious fasciculations, diminished reflexes particularly of the lower body but nonspecific findings, mildly thin muscle bulk globally.  He does have degenerative cervical and lumbar spine disease with status post surgeries including joint replacement surgery, he has a history of scoliosis and ongoing headache type pain.  I suggested we proceed with additional work-up from my end of things with blood work, we will include Lyme titers.  We will also proceed with EMG nerve conduction velocity testing.  We will call him with his test results, we will plan a follow-up in this clinic accordingly.  I answered all the questions today and the patient and his daughter were in agreement.  We talked about the pros differential diagnosis for his concerns and further work-up may be needed from the general medical standpoint/endocrinology/GI.  He is scheduled for a high-resolution CT scan of the chest next week.  He is advised to follow-up with you closely and continue to work on good nutrition and good hydration as well as strength exercises with the help of his physical therapist.  Plan to remove the questions today and the patient and his daughter were in agreement with our plan. Thank you very much for allowing me to participate in the  care of this nice patient. If I can be of any further assistance to you please do not hesitate to call me at [phone].  Sincerely,   Frank Age, MD, PhD

## 2021-09-21 ENCOUNTER — Ambulatory Visit
Admission: RE | Admit: 2021-09-21 | Discharge: 2021-09-21 | Disposition: A | Discharge status: Home / Self Care | Payer: PPO | Source: Ambulatory Visit | Attending: Internal Medicine | Admitting: Internal Medicine

## 2021-09-21 ENCOUNTER — Other Ambulatory Visit: Payer: Self-pay

## 2021-09-21 DIAGNOSIS — J984 Other disorders of lung: Secondary | ICD-10-CM

## 2021-09-21 DIAGNOSIS — J8410 Pulmonary fibrosis, unspecified: Secondary | ICD-10-CM | POA: Diagnosis not present

## 2021-09-21 LAB — COMPREHENSIVE METABOLIC PANEL
ALT: 18 IU/L (ref 0–44)
AST: 19 IU/L (ref 0–40)
Albumin/Globulin Ratio: 2.2 (ref 1.2–2.2)
Albumin: 4.7 g/dL (ref 3.7–4.7)
Alkaline Phosphatase: 98 IU/L (ref 44–121)
BUN/Creatinine Ratio: 30 — ABNORMAL HIGH (ref 10–24)
BUN: 22 mg/dL (ref 8–27)
Bilirubin Total: 0.6 mg/dL (ref 0.0–1.2)
CO2: 22 mmol/L (ref 20–29)
Calcium: 9.8 mg/dL (ref 8.6–10.2)
Chloride: 102 mmol/L (ref 96–106)
Creatinine, Ser: 0.74 mg/dL — ABNORMAL LOW (ref 0.76–1.27)
Globulin, Total: 2.1 g/dL (ref 1.5–4.5)
Glucose: 87 mg/dL (ref 70–99)
Potassium: 4.1 mmol/L (ref 3.5–5.2)
Sodium: 142 mmol/L (ref 134–144)
Total Protein: 6.8 g/dL (ref 6.0–8.5)
eGFR: 96 mL/min/{1.73_m2} (ref >59)

## 2021-09-21 LAB — MULTIPLE MYELOMA PANEL, SERUM
Albumin SerPl Elph-Mcnc: 3.8 g/dL (ref 2.9–4.4)
Albumin/Glob SerPl: 1.3 (ref 0.7–1.7)
Alpha 1: 0.3 g/dL (ref 0.0–0.4)
Alpha2 Glob SerPl Elph-Mcnc: 1 g/dL (ref 0.4–1.0)
B-Globulin SerPl Elph-Mcnc: 0.9 g/dL (ref 0.7–1.3)
Gamma Glob SerPl Elph-Mcnc: 0.8 g/dL (ref 0.4–1.8)
Globulin, Total: 3 g/dL (ref 2.2–3.9)
IgA/Immunoglobulin A, Serum: 43 mg/dL — ABNORMAL LOW (ref 61–437)
IgG (Immunoglobin G), Serum: 800 mg/dL (ref 603–1613)
IgM (Immunoglobulin M), Srm: 79 mg/dL (ref 15–143)

## 2021-09-21 LAB — RPR: RPR Ser Ql: NONREACTIVE

## 2021-09-21 LAB — LYME DISEASE SEROLOGY W/REFLEX: Lyme Total Antibody EIA: NEGATIVE

## 2021-09-21 LAB — CK: Total CK: 55 U/L (ref 41–331)

## 2021-09-21 LAB — C-REACTIVE PROTEIN: CRP: 7 mg/L (ref 0–10)

## 2021-09-21 LAB — B12 AND FOLATE PANEL
Folate: 10.5 ng/mL (ref >3.0)
Vitamin B-12: 805 pg/mL (ref 232–1245)

## 2021-09-21 LAB — HIV ANTIBODY (ROUTINE TESTING W REFLEX): HIV Screen 4th Generation wRfx: NONREACTIVE

## 2021-09-21 LAB — SEDIMENTATION RATE: Sed Rate: 11 mm/hr (ref 0–30)

## 2021-09-21 LAB — VITAMIN B1: Thiamine: 194.1 nmol/L (ref 66.5–200.0)

## 2021-09-21 LAB — RHEUMATOID FACTOR: Rheumatoid fact SerPl-aCnc: <10 IU/mL (ref <14.0)

## 2021-09-21 LAB — VITAMIN B6: Vitamin B6: 29.5 ug/L (ref 3.4–65.2)

## 2021-09-21 LAB — ANA W/REFLEX: Anti Nuclear Antibody (ANA): NEGATIVE

## 2021-09-21 LAB — VITAMIN D 25 HYDROXY (VIT D DEFICIENCY, FRACTURES): Vit D, 25-Hydroxy: 47.1 ng/mL (ref 30.0–100.0)

## 2021-09-22 ENCOUNTER — Telehealth: Payer: Self-pay | Admitting: *Deleted

## 2021-09-22 NOTE — Telephone Encounter (Signed)
Called patient and LVM (ok per DPR) advising him that his labs are benign, no concerns or causes seen for symptoms.  Reminded patient of his EMG nerve conduction velocity test on February 9 at 2:00.  Left office number for call back if he has any questions.

## 2021-09-22 NOTE — Telephone Encounter (Signed)
-----   Message from Star Age, MD sent at 09/21/2021  4:40 PM EST ----- Labs were benign.  Please update patient.

## 2021-09-22 NOTE — Telephone Encounter (Signed)
I spoke to the patient and provided him with the results.

## 2021-09-28 DIAGNOSIS — R634 Abnormal weight loss: Secondary | ICD-10-CM | POA: Diagnosis not present

## 2021-09-28 DIAGNOSIS — N3281 Overactive bladder: Secondary | ICD-10-CM | POA: Diagnosis not present

## 2021-09-28 DIAGNOSIS — J849 Interstitial pulmonary disease, unspecified: Secondary | ICD-10-CM | POA: Diagnosis not present

## 2021-09-28 DIAGNOSIS — J069 Acute upper respiratory infection, unspecified: Secondary | ICD-10-CM | POA: Diagnosis not present

## 2021-09-28 DIAGNOSIS — E785 Hyperlipidemia, unspecified: Secondary | ICD-10-CM | POA: Diagnosis not present

## 2021-09-28 DIAGNOSIS — I10 Essential (primary) hypertension: Secondary | ICD-10-CM | POA: Diagnosis not present

## 2021-09-28 DIAGNOSIS — M6281 Muscle weakness (generalized): Secondary | ICD-10-CM | POA: Diagnosis not present

## 2021-09-28 DIAGNOSIS — K8689 Other specified diseases of pancreas: Secondary | ICD-10-CM | POA: Diagnosis not present

## 2021-09-28 DIAGNOSIS — Z1152 Encounter for screening for COVID-19: Secondary | ICD-10-CM | POA: Diagnosis not present

## 2021-10-21 ENCOUNTER — Other Ambulatory Visit: Payer: Self-pay

## 2021-10-21 ENCOUNTER — Encounter: Payer: Self-pay | Admitting: Gastroenterology

## 2021-10-21 ENCOUNTER — Ambulatory Visit (INDEPENDENT_AMBULATORY_CARE_PROVIDER_SITE_OTHER): Payer: PPO | Admitting: Gastroenterology

## 2021-10-21 VITALS — BP 122/82 | HR 66 | Ht 65.0 in | Wt 146.1 lb

## 2021-10-21 DIAGNOSIS — R5383 Other fatigue: Secondary | ICD-10-CM

## 2021-10-21 DIAGNOSIS — K449 Diaphragmatic hernia without obstruction or gangrene: Secondary | ICD-10-CM

## 2021-10-21 DIAGNOSIS — Z86010 Personal history of colonic polyps: Secondary | ICD-10-CM

## 2021-10-21 DIAGNOSIS — R634 Abnormal weight loss: Secondary | ICD-10-CM

## 2021-10-21 DIAGNOSIS — K5730 Diverticulosis of large intestine without perforation or abscess without bleeding: Secondary | ICD-10-CM

## 2021-10-21 DIAGNOSIS — K2100 Gastro-esophageal reflux disease with esophagitis, without bleeding: Secondary | ICD-10-CM

## 2021-10-21 NOTE — Patient Instructions (Signed)
If you are age 74 or older, your body mass index should be between 23-30. Your Body mass index is 24.32 kg/m. If this is out of the aforementioned range listed, please consider follow up with your Primary Care Provider.  If you are age 86 or younger, your body mass index should be between 19-25. Your Body mass index is 24.32 kg/m. If this is out of the aformentioned range listed, please consider follow up with your Primary Care Provider.   ________________________________________________________  The  GI providers would like to encourage you to use [facility] to communicate with providers for non-urgent requests or questions.  Due to long hold times on the telephone, sending your provider a message by [facility] may be a faster and more efficient way to get a response.  Please allow 48 business hours for a response.  Please remember that this is for non-urgent requests.  _______________________________________________________  Please follow up in 6 months by calling our office to schedule an appointment  It was a pleasure to see you today!  Vito Cirigliano, D.O.

## 2021-10-21 NOTE — Progress Notes (Signed)
Chief Complaint:    Weight loss  GI History: 76 year old male with history of HTN, HLD, interstitial lung disease, osteoarthritis, DDD, BPH, ccy, neck and lumbar spine surgery, follows in the GI clinic for diverticulosis, internal hemorrhoids, GERD with erosive esophagitis, hiatal hernia, and history of colon polyps.  Does have a history of diverticulitis and has been treated with Flagyl over the years.  Last episode 2021.  Endoscopic History: - Colonoscopy (09/2013): Tubular adenoma x1, HP x1, moderate diverticulosis, internal hemorrhoids.  Recommended 5-year repeat - EGD (09/2020): 2 cm HH, 2 cm tongue of salmon-colored mucosa but biopsies negative for intestinal metaplasia (just reflux changes), LA Grade D esophagitis, moderate gastritis (path negative for H. pylori), moderate duodenitis in duodenal bulb (path benign).  Treated with high-dose Protonix - Colonoscopy (09/2020): 5 mm descending colon polyp (path: TA), Sigmoid diverticulosis with localized area of inflammation at 25-35 cm (path: Benign), Otherwise normal colon (path negative for MC, IBD).  Internal hemorrhoids, normal TI.  Repeat in 7 years  HPI:     Patient is a 76 y.o. male presenting to the Gastroenterology Clinic for follow-up.  Last seen in the office on 08/14/2020 by Alonza Bogus with subsequent colonoscopy completed 09/22/2020 as outlined above.  Main issue at that time was 15 pound unintentional weight loss, weakness, fatigue, alternating bowel habits of diarrhea/constipation, abdominal bloating.  - CT C/A/P (08/2020): Tortuous thoracic aorta, borderline cardiomegaly, LLL calcified granuloma.  Normal liver, pancreas, spleen.  Colonic diverticulosis otherwise normal GI tract.  Left nephrolithiasis, prostamegaly. - EGD/colonoscopy (09/2020): As above.  Was treated with Protonix 40 mg p.o. twice daily x6 weeks for LA Grade D esophagitis - CT C/A/P GI tract (08/2021; indication cough and weight loss): Diverticulosis without  diverticulitis, normal GI tract, liver, pancreas, spleen - CT chest high-resolution (09/2021): 7 mm low-attenuation lesion in left hepatic lobe otherwise normal liver and visualized GI tract.  Mild pulmonary fibrosis, a sending aortic aneurysm - Reviewed labs from 09/2021: Normal B1, B6, B12, folate, CRP, ESR, CK, ANA  His main issue today is of continued evaluation of weight loss over the last 2 years from 165# baseline to nadir of 135#.  Currently 146# in office today (but was 139# at home this AM). Attributes this recent wt gain to dietary mods; 5-6 meals/day with 3000+ calories. 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  Aside from fatigue, no other complaints.  Good p.o. intake, no diarrhea, no abdominal pain, no hematochezia or melena.  No nausea/vomiting.  Has been following in the Neurology Clinic for weakness.  He has also worked with Physical Therapy and a Nutritionist. Has referral in place to Endocrinology for evaluation of wt loss as well. Has appt with Pulmonary Clinic tomorrow for ILD w/u.   Started taking Creon. Does not have diarreha, acholic stolls, abdominal pain, etc though. Not sure the Creon has helped at all despite taking for the last few months.     Review of systems:     No chest pain, no SOB, no fevers, no urinary sx   Past Medical History:  Diagnosis Date   Adenomatous polyp of colon 06/20/2003   BPH (benign prostatic hypertrophy)    DDD (degenerative disc disease)    Diverticulosis    ED (erectile dysfunction)    Hyperlipidemia    Hypertension    Osteoarthritis     Patient's surgical history, family medical history, social history, medications and allergies were all reviewed in Epic    Current Outpatient Medications  Medication  Sig Dispense Refill   CREON 36000-114000 units CPEP capsule Take by mouth.     DULoxetine (CYMBALTA) 30 MG capsule Take 30 mg by mouth daily.     ibandronate (BONIVA) 150 MG tablet Take 1 tablet by mouth every 30 (thirty)  days.     lisinopril (PRINIVIL,ZESTRIL) 10 MG tablet Take 10 mg by mouth daily.     pantoprazole (PROTONIX) 40 MG tablet Take 1 tablet (40 mg total) by mouth daily. 90 tablet 3   REPATHA SURECLICK 115 MG/ML SOAJ Inject into the skin every 14 (fourteen) days.     sildenafil (REVATIO) 20 MG tablet Take 20 mg by mouth daily as needed.      No current facility-administered medications for this visit.    Physical Exam:     BP 122/82    Pulse 66    Ht 5' 5"  (1.651 m)    Wt 146 lb 2 oz (66.3 kg)    SpO2 97%    BMI 24.32 kg/m   GENERAL:  Pleasant male in NAD PSYCH: : Cooperative, normal affect EENT:  conjunctiva pink, mucous membranes moist CARDIAC:  RRR, no murmur heard, no peripheral edema PULM: Normal respiratory effort, lungs CTA bilaterally, no wheezing ABDOMEN:  Nondistended, soft, nontender. No obvious masses, no hepatomegaly,  normal bowel sounds SKIN:  turgor, no lesions seen Musculoskeletal:  Normal muscle tone, normal strength NEURO: Alert and oriented x 3, no focal neurologic deficits   IMPRESSION and PLAN:    1) Weight loss - Discussed GI etiologies for weight loss at length today.  Has had an extensive work-up to include EGD, colonoscopy, and multiple CT scans, which have all been largely unrevealing from a weight loss standpoint.  Weight somewhat improving after making dietary modifications with increased caloric intake.  Otherwise maintains a very healthy, active lifestyle - No associated micronutrient deficiencies, anemia, and normal inflammatory markers - Has appointment with Pulmonary tomorrow along with referral to Endocrinology in place.  If those work-ups are similarly unrevealing, discussed considering Video Capsule Endoscopy to rule out subtle small bowel disease - Can trial off Creon.  If any development of diarrhea, loose stools, acholic stools, etc., plan for pancreatic fecal elastase  2) GERD with erosive esophagitis, Hiatal hernia - Reflux symptoms well  controlled on current therapy - Continue Protonix 40 mg/day - Continue antireflux lifestyle/dietary modifications with avoidance of exacerbating foods - If work-up for above weight loss all unrevealing, again could consider repeat upper endoscopy to evaluate for appropriate healing of erosive esophagitis given previous severity.  However, less inclined that reflux playing a role when he does not endorse any UGI symptoms  4) History of colon polyps - Colonoscopy 2021 with 5 mm tubular adenoma.  Repeat colonoscopy in 2028 for ongoing surveillance pending overall health and patient wishes  5) Interstitial Lung Disease - Has Pulmonary consultation tomorrow for ILD on imaging  6) Diverticulosis - History of diverticulitis.  No recent episodes  7) Fatigue - Continue follow-up in the Neurology clinic  RTC in 6 months or sooner as needed    I spent 35 minutes of time, including in depth chart review, independent review of results as outlined above, communicating results with the patient directly, face-to-face time with the patient, coordinating care, and ordering studies and medications as appropriate, and documentation.    Lavena Bullion ,DO, FACG 10/21/2021, 2:20 PM

## 2021-10-22 ENCOUNTER — Encounter: Payer: Self-pay | Admitting: Internal Medicine

## 2021-10-22 ENCOUNTER — Ambulatory Visit: Payer: PPO | Admitting: Internal Medicine

## 2021-10-22 ENCOUNTER — Other Ambulatory Visit: Payer: Self-pay

## 2021-10-22 VITALS — BP 128/80 | HR 76 | Temp 98.1°F | Ht 65.0 in | Wt 145.2 lb

## 2021-10-22 DIAGNOSIS — J849 Interstitial pulmonary disease, unspecified: Secondary | ICD-10-CM

## 2021-10-22 DIAGNOSIS — R634 Abnormal weight loss: Secondary | ICD-10-CM | POA: Diagnosis not present

## 2021-10-22 NOTE — Patient Instructions (Addendum)
ICD-10-CM   1. ILD (interstitial lung disease) ([place])  J84.9     2. Abnormal weight loss  R63.4       Unclear to me if you just have early interstitial lung abnormalities (ILA) or actual ILD DO not think weight loss is related to CT chest fidnings but glad is improvng after eating > 3000 calories  Plan - check ccp, myositis panel, scl-70, ssa, ssb, DS-dna, quantiferon gold - do full PFT - will discuss in our case conference Feb or March 2023  Followup - folloup to discuss test results in latter part of feb 2023 or march 2023 - discuss watchful waiting v biopsy approach; most likely the former

## 2021-10-22 NOTE — Progress Notes (Signed)
OV 10/22/2021  Subjective:  Patient ID: Frank Vance, male , DOB: 1945-10-19 , age 76 y.o. , MRN: [id number] , ADDRESS: [place] [address] PCP Frank Infante, MD Patient Care Team: Frank Infante, MD as PCP - General (Internal Medicine)  This Provider for this visit: Treatment Team:  Attending Provider: Brand Males, MD    10/22/2021 -   Chief Complaint  Patient presents with   Consult    Pt here for new ILD eval. Pt denies any complaints of cough, SOB, chest tightness. States that he has had unexplained weight loss.     HPI Frank Vance 76 y.o. -new consult evaluation referred by Frank Vance and Dr. Ileana Vance.  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  He tells me that over the last couple of years after right hip replacement he started losing weight abnormally.  He lost from 160 pounds to 130 pounds.  The weight loss is continued up until 3 weeks ago when it is stabilized.  Extensive work-up so far has been negative except that he had a CT scan of the chest that suggested ILD and therefore has been referred here.  He believes that he is a high metabolizer.  Even when he was a kid and through his married life he had to eat a lot.  He said that when he was in the [employer] in the 1970s he had 5000 cal or more and he was still losing weight.  He says approximately 3 weeks ago with the help of nutritionist and Specialist he started eating 3000 cal and since then the weight loss is stopped.  He is going to see endocrinologist.  He believes his thyroid function is normal.  He might even gained a few pounds of weight.  His high-resolution CT chest reported as indeterminate for UIP and therefore has been referred here.  [facility] Comprehensive ILD Questionnaire  Symptoms: Can SYMPTOM SCALE - ILD 10/22/2021  Current weight   O2 use ra  Shortness of  Breath 0 -> 5 scale with 5 being worst (score 6 If unable to do)  At rest 0  Simple tasks - showers, clothes change, eating, shaving 0  Household (dishes, doing bed, laundry) 1  Shopping   Walking level at own pace 1  Walking up Stairs 1  Total (30-36) Dyspnea Score 3  How bad is your cough? 2  How bad is your fatigue 00  How bad is nausea 0  How bad is vomiting?  0  How bad is diarrhea? 0  How bad is anxiety? 0  How bad is depression 0  Any chronic pain - if so where and how bad -Chronic back and neck pain and in the hips.  Pain scale 4-5 but he still is able to play golf.       Past Medical History :  -Idiopathic unexplained weight loss extensive work-up so far negative.  Has seen GI.  Awaiting to see endocrinology -HIV negative, rheumatoid factor and ANA negative, CK negative [-Denies collagen vascular disease - Status post right hip replacement 2 years ago - Severe chronic back and neck pain x40 years - By history thyroid is normal - Has had COVID-vaccine but has not had COVID   ROS:  -Has fatigue and unexplained weight loss - Has neck back and lower hip pain but no shoulder pain or knee  pain or wrist pain or finger joint pain - Close to 30 pound weight loss in the last 2 years - No acid reflux.  No snoring no rash  FAMILY HISTORY of LUNG DISEASE:  -No family history of pulmonary disease  PERSONAL EXPOSURE HISTORY:  -Between 1971 and 1970 03/1976 was in the [employer] in the [employer] in college terms.  Never smoked.  In the remote past occasional use of marijuana but no cocaine or intravenous drug use  HOME  EXPOSURE and HOBBY DETAILS :  -[place] the [place] 2 years.  The house is 74 years old.  Previously lived in a home built in 1964 for 10 years.  Extensive organic antigen history is negative.  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  But most of the time farm hands are doing the work.   He wore a down ski jacket many years ago.  He has a Customer service manager and occasionally uses it once a month.  OCCUPATIONAL HISTORY (122 questions) :  -He is an Forensic psychologist.  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  Exposed a lot of asbestos and painting solvents.  Did not wear gloves did not wear a mask.  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  I personally visualized this and also the lung images from an abdominal CT in 2021.  I am barely able to elucidate or visualize any ILD here.   Simple office walk 185 feet x  3 laps goal with forehead probe 10/22/2021    O2 used ra   Number laps completed 3   Comments about pace brisk   Resting Pulse Ox/HR 100% and 76/min   Final Pulse Ox/HR 99% and 94/min   Desaturated </= 88% no   Desaturated <= 3% points no   Got Tachycardic >/= 90/min yes   Symptoms at end of test No symptoms   Miscellaneous comments x       CT Chest data - HRCT Dec 2022  Narrative & Impression  CLINICAL DATA:  Abnormal lung findings on CT chest with possible interstitial lung disease.   EXAM: CT CHEST WITHOUT CONTRAST   TECHNIQUE: Multidetector CT imaging of the chest was performed following the standard protocol without intravenous contrast. High resolution imaging of the lungs, as well as inspiratory and expiratory imaging, was performed.   COMPARISON:  08/25/2021 and 08/25/2020.   FINDINGS: Cardiovascular: Atherosclerotic calcification of the aorta, aortic valve and coronary arteries. Ascending aorta measures up to 4.2 cm. Heart size normal. No pericardial effusion.   Mediastinum/Nodes: Mediastinal lymph nodes are not enlarged by CT size criteria. Hilar  regions are difficult to evaluate without IV contrast. No axillary adenopathy. Esophagus is grossly unremarkable.   Lungs/Pleura: Subtle areas of subpleural ground-glass with minimal subpleural reticular densities and no definite zonal predominance. No definitive traction bronchiectasis/bronchiolectasis, architectural distortion or honeycombing. No pleural fluid. Airway is unremarkable. Minimal air trapping.   Upper Abdomen: Possible subcentimeter subtle low-attenuation lesion in the left hepatic lobe, 7 mm (2/143), too small to characterize. Visualized portions of the liver, visualized portions of the liver and gallbladder are otherwise unremarkable.  Fluid density nodule in the right adrenal gland measures 2.4 cm and in the left adrenal gland, up to 2.2 cm. Subcentimeter low-attenuation lesion in the right kidney is too small to characterize. Right renal artery calcification. Visualized portion of the left kidney, spleen, pancreas, stomach and bowel are grossly unremarkable. Atherosclerotic calcification of the aorta.   Musculoskeletal: Degenerative changes in the spine. No worrisome lytic or sclerotic lesions.   IMPRESSION: 1. 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 Findings are indeterminate for UIP per consensus guidelines: Diagnosis of Idiopathic Pulmonary Fibrosis: An Official ATS/ERS/JRS/ALAT Clinical Practice Guideline. [place], Iss 5, [phone], Jun 11 2017. 2. Air trapping is indicative of small airways disease. 3. Ascending aortic aneurysm. Recommend follow-up every 12 months and vascular consultation. This recommendation follows ACR consensus guidelines: White Paper of the ACR Incidental Findings Committee II on Vascular Findings. J Am Coll Radiol 2013; 10:789-794. 4. Bilateral adrenal adenomas. 5. Aortic atherosclerosis (ICD10-I70.0). Coronary artery calcification.     Electronically Signed   By:  Lorin Picket M.D.   On: 09/22/2021 13:38    No results found.   Latest Reference Range & Units 09/17/21 11:55  AST 0 - 40 IU/L 19  ALT 0 - 44 IU/L 18    Latest Reference Range & Units 08/25/20 07:54 09/17/21 11:55  Creatinine 0.76 - 1.27 mg/dL 0.70 0.74 (L)  (L): Data is abnormally low   Latest Reference Range & Units 09/17/21 11:55  CK Total 41 - 331 U/L 55    No flowsheet data found. ondering if you could take a quick glance and let me know if there is sufficient information to lean one way or the other.     has a past medical history of Adenomatous polyp of colon (06/20/2003), BPH (benign prostatic hypertrophy), DDD (degenerative disc disease), Diverticulosis, ED (erectile dysfunction), Hyperlipidemia, Hypertension, and Osteoarthritis.   reports that he has never smoked. He has never used smokeless tobacco.  Past Surgical History:  Procedure Laterality Date   CERVICAL FUSION  1997   LUMBAR LAMINECTOMY     REPLANTATION FINGER Right    TONSILLECTOMY     TOTAL HIP ARTHROPLASTY Right     Allergies  Allergen Reactions   Statins     Immunization History  Administered Date(s) Administered   Influenza Split 12/19/2009, 02/04/2010, 03/17/2011, 09/17/2011, 06/29/2012, 10/11/2012   Influenza, High Dose Seasonal PF 07/11/2021   Influenza, Quadrivalent, Recombinant, Inj, Pf 07/18/2018, 07/15/2020   Influenza,inj,Quad PF,6+ Mos 07/13/2013   Influenza,inj,quad, With Preservative 07/04/2019   PFIZER(Purple Top)SARS-COV-2 Vaccination 12/03/2019, 12/25/2019, 09/11/2020, 05/27/2021   Pneumococcal Conjugate-13 02/05/2015   Tdap 08/01/2013   Zoster, Live 01/08/2009    Family History  Problem Relation Age of Onset   Alzheimer's disease Mother    Cancer Father        bile duct   Diabetes Maternal Uncle    Diabetes Maternal Aunt    Colon cancer Neg Hx    Esophageal cancer Neg Hx    Rectal cancer Neg Hx    Stomach cancer Neg Hx      Current Outpatient Medications:     CREON 36000-114000 units CPEP capsule, Take by mouth., Disp: , Rfl:    DULoxetine (CYMBALTA) 30 MG capsule, Take 30 mg by mouth daily., Disp: , Rfl:    ibandronate (BONIVA) 150 MG tablet, Take 1 tablet by mouth every 30 (thirty) days., Disp: , Rfl:    lisinopril (PRINIVIL,ZESTRIL) 10 MG tablet, Take 10 mg by  mouth daily., Disp: , Rfl:    pantoprazole (PROTONIX) 40 MG tablet, Take 1 tablet (40 mg total) by mouth daily., Disp: 90 tablet, Rfl: 3   REPATHA SURECLICK 003 MG/ML SOAJ, Inject into the skin every 14 (fourteen) days., Disp: , Rfl:    sildenafil (REVATIO) 20 MG tablet, Take 20 mg by mouth daily as needed. , Disp: , Rfl:       Objective:   Vitals:   10/22/21 1449  BP: 128/80  Pulse: 76  Temp: 98.1 F (36.7 C)  TempSrc: Oral  SpO2: 100%  Weight: 145 lb 3.2 oz (65.9 kg)  Height: 5\' 5"  (1.651 m)    Estimated body mass index is 24.16 kg/m as calculated from the following:   Height as of this encounter: 5\' 5"  (1.651 m).   Weight as of this encounter: 145 lb 3.2 oz (65.9 kg).  @WEIGHTCHANGE @  [employer]   10/22/21 1449  Weight: 145 lb 3.2 oz (65.9 kg)     Physical Exam  General: No distress.  Thin with loose pants Neuro: Alert and Oriented x 3. GCS 15. Speech normal Psych: Pleasant Resp:  Barrel Chest - no.  Wheeze - no, Crackles - no, No overt respiratory distress CVS: Normal heart sounds. Murmurs - no Ext: Stigmata of Connective Tissue Disease - no some scoliosis present HEENT: Normal upper airway. PEERL +. No post nasal drip        Assessment:       ICD-10-CM   1. ILD (interstitial lung disease) (HCC)  B04.8 Cyclic citrul peptide antibody, IgG    Myositis Specific II Antibodies Panel    Sjogren's syndrome antibods(ssa + ssb)    Anti-DNA antibody, double-stranded    QuantiFERON-TB Gold Plus    Pulmonary function test    QuantiFERON-TB Gold Plus    Anti-DNA antibody, double-stranded    Sjogren's syndrome antibods(ssa + ssb)    Cyclic citrul peptide  antibody, IgG    2. Abnormal weight loss  R63.4          Plan:     Patient Instructions     ICD-10-CM   1. ILD (interstitial lung disease) ([place])  J84.9     2. Abnormal weight loss  R63.4       Unclear to me if you just have early interstitial lung abnormalities (ILA) or actual ILD DO not think weight loss is related to CT chest fidnings but glad is improvng after eating > 3000 calories  Plan - check ccp, myositis panel, scl-70, ssa, ssb, DS-dna, quantiferon gold - do full PFT - will discuss in our case conference Feb or March 2023  Followup - folloup to discuss test results in latter part of feb 2023 or march 2023 - discuss watchful waiting v biopsy approach; most likely the former     SIGNATURE    Dr. Brand Vance, M.D., F.C.C.P,  Pulmonary and Critical Care Medicine Staff Physician, [place] Director - Interstitial Lung Disease  Program  [facility] [place], [place], [address]  Pager: [phone], If no answer or between  15:00h - 7:00h: call [phone] Telephone: [phone]  3:45 PM 10/22/2021

## 2021-10-28 LAB — CYCLIC CITRUL PEPTIDE ANTIBODY, IGG: Cyclic Citrullin Peptide Ab: <16 UNITS

## 2021-10-28 LAB — MYOSITIS SPECIFIC II ANTIBODIES PANEL
EJ AB: <11 SI (ref <11)
JO-1 AB: <11 SI (ref <11)
MDA-5 AB: <11 SI (ref <11)
MI-2 ALPHA AB: <11 SI (ref <11)
MI-2 BETA AB: <11 SI (ref <11)
NXP-2 AB: <11 SI (ref <11)
OJ AB: <11 SI (ref <11)
PL-12 AB: <11 SI (ref <11)
PL-7 AB: <11 SI (ref <11)
SRP-AB: <11 SI (ref <11)
TIF-1y AB: <11 SI (ref <11)

## 2021-10-28 LAB — SJOGREN'S SYNDROME ANTIBODS(SSA + SSB)
SSA (Ro) (ENA) Antibody, IgG: <1 AI
SSB (La) (ENA) Antibody, IgG: <1 AI

## 2021-10-28 LAB — QUANTIFERON-TB GOLD PLUS
Mitogen-NIL: >10 IU/mL
NIL: 0.03 IU/mL
QuantiFERON-TB Gold Plus: NEGATIVE
TB1-NIL: 0.04 IU/mL
TB2-NIL: 0.02 IU/mL

## 2021-10-28 LAB — ANTI-DNA ANTIBODY, DOUBLE-STRANDED: ds DNA Ab: 2 IU/mL

## 2021-11-19 ENCOUNTER — Encounter: Payer: PPO | Admitting: Diagnostic Neuroimaging

## 2021-11-19 ENCOUNTER — Ambulatory Visit (INDEPENDENT_AMBULATORY_CARE_PROVIDER_SITE_OTHER): Payer: PPO | Admitting: Diagnostic Neuroimaging

## 2021-11-19 DIAGNOSIS — W57XXXS Bitten or stung by nonvenomous insect and other nonvenomous arthropods, sequela: Secondary | ICD-10-CM

## 2021-11-19 DIAGNOSIS — M6250 Muscle wasting and atrophy, not elsewhere classified, unspecified site: Secondary | ICD-10-CM

## 2021-11-19 DIAGNOSIS — R634 Abnormal weight loss: Secondary | ICD-10-CM

## 2021-11-19 DIAGNOSIS — Z0289 Encounter for other administrative examinations: Secondary | ICD-10-CM

## 2021-11-19 DIAGNOSIS — R531 Weakness: Secondary | ICD-10-CM | POA: Diagnosis not present

## 2021-11-24 ENCOUNTER — Encounter: Payer: Self-pay | Admitting: Internal Medicine

## 2021-11-26 NOTE — Procedures (Signed)
[facility]  NCS (NERVE CONDUCTION STUDY) WITH EMG (ELECTROMYOGRAPHY) REPORT   STUDY DATE: 11/26/21 PATIENT NAME: Frank Vance DOB: 1945/10/29 MRN: [id number]  ORDERING CLINICIAN: Star Age, MD PhD   TECHNOLOGIST: Sherre Scarlet ELECTROMYOGRAPHER: Earlean Polka. Sparrow [place], MD  CLINICAL INFORMATION: 76 year old male with generalized weakness.  FINDINGS: NERVE CONDUCTION STUDY:  Left median motor response could not be obtained.  Right median motor response is prolonged distal latency, normal amplitude and normal conduction velocity.  Left ulnar motor response is normal.  Left peroneal and left tibial motor responses of decreased amplitudes and normal conduction velocities.  Left ulnar and left tibial F wave latencies are normal.  Left radial, left sural and left superficial peroneal sensory responses are normal.  Left median sensory response could not be obtained.  Right median sensory response has prolonged peak latency and decreased amplitude.  Left ulnar sensory response is normal.    NEEDLE ELECTROMYOGRAPHY:  Needle examination of left upper and left lower extremities shows no abnormal spontaneous activity at rest.  Early polyphasic recruitment of motor units noted in left iliopsoas, left gluteus medius, left deltoid, left flexor carpi radialis muscles.  Decreased recruitment of large motor units noted in left triceps.   IMPRESSION:   Abnormal study demonstrating: -Bilateral median neuropathies at the wrist consistent with bilateral carpal tunnel syndrome.  This is more severe on the left compared to right side. - Myopathic recruitment noted in muscles of the left upper and left lower extremity.  Considerations could include autoimmune, inflammatory or degenerative myopathic disorders. - Abnormal left peroneal and left tibial motor responses may be related to left lumbosacral radiculopathies versus underlying myopathic disorder.   INTERPRETING  PHYSICIAN:  Penni Bombard, MD Certified in Neurology, Neurophysiology and Neuroimaging  [facility] [address] [place], [place] [address] [phone]   [facility]    Nerve / Sites Muscle Latency Ref. Amplitude Ref. Rel Amp Segments Distance Velocity Ref. Area    ms ms mV mV %  cm m/s m/s mVms  L Median - APB     Wrist APB NR ?4.4 NR ?4.0 NR Wrist - APB 7   NR     Upper arm APB NR  NR  NR Upper arm - Wrist 21 NR ?49 NR  R Median - APB     Wrist APB 5.5 ?4.4 5.6 ?4.0 100 Wrist - APB 7   19.3     Upper arm APB 9.3  5.4  95.6 Upper arm - Wrist 21 56 ?49 15.7  L Ulnar - ADM     Wrist ADM 2.7 ?3.3 10.7 ?6.0 100 Wrist - ADM 7   40.5     B.Elbow ADM 5.8  10.3  96.1 B.Elbow - Wrist 17 56 ?49 39.7     A.Elbow ADM 7.6  10.2  99.1 A.Elbow - B.Elbow 10 55 ?49 39.7  L Peroneal - EDB     Ankle EDB 4.6 ?6.5 1.4 ?2.0 100 Ankle - EDB 9   6.4     Fib head EDB 10.5  1.2  80.2 Fib head - Ankle 28 47 ?44 5.6     Pop fossa EDB 12.6  0.8  71.8 Pop fossa - Fib head 10 46 ?44 4.3         Pop fossa - Ankle      L Tibial - AH     Ankle AH 3.0 ?5.8 2.2 ?4.0 100 Ankle - AH 9   5.4  Pop fossa AH 12.3  1.8  79.9 Pop fossa - Ankle 40 43 ?41 7.3                SNC    Nerve / Sites Rec. Site Peak Lat Ref.  Amp Ref. Segments Distance    ms ms V V  cm  L Radial - Anatomical snuff box (Forearm)     Forearm Wrist 2.2 ?2.9 23 ?15 Forearm - Wrist 10  L Sural - Ankle (Calf)     Calf Ankle 3.2 ?4.4 12 ?6 Calf - Ankle 14  L Superficial peroneal - Ankle     Lat leg Ankle 3.2 ?4.4 5 ?6 Lat leg - Ankle 14  L Median - Orthodromic (Dig II, Mid palm)     Dig II Wrist NR ?3.4 NR ?10 Dig II - Wrist 13  R Median - Orthodromic (Dig II, Mid palm)     Dig II Wrist 4.7 ?3.4 6 ?10 Dig II - Wrist 13  L Ulnar - Orthodromic, (Dig V, Mid palm)     Dig V Wrist 2.7 ?3.1 5 ?5 Dig V - Wrist 53                 F  Wave    Nerve F Lat Ref.   ms ms  L Tibial - AH 51.7 ?56.0  L Ulnar - ADM 26.9  ?32.0         EMG Summary Table    Spontaneous MUAP Recruitment  Muscle IA Fib PSW Fasc Other Amp Dur. Poly Pattern  L. Vastus medialis Normal None None None _______ Normal Normal Normal Normal  L. Tibialis anterior Normal None None None _______ Normal Normal Normal Normal  L. Gastrocnemius (Medial head) Normal None None None _______ Normal Normal Normal Normal  L. Iliopsoas Normal None None None _______ Decreased Normal 1+ Early  L. Gluteus medius Normal None None None _______ Decreased Normal 1+ Early  L. Lumbar paraspinals Normal None None None _______ Normal Normal Normal Normal  L. Deltoid Normal None None None _______ Decreased Normal 1+ Early  L. Biceps brachii Normal None None None _______ Normal Normal Normal Normal  L. Triceps brachii Normal None None None _______ Increased Normal Normal Reduced  L. Flexor carpi radialis Normal None None None _______ Normal Increased 1+ Early  L. First dorsal interosseous Normal None None None _______ Normal Normal Normal Normal  L. Cervical paraspinals Normal None None None _______ Normal Normal Normal Normal

## 2021-12-03 ENCOUNTER — Telehealth: Payer: Self-pay | Admitting: *Deleted

## 2021-12-03 NOTE — Telephone Encounter (Signed)
-----   Message from Star Age, MD sent at 11/27/2021  1:33 PM EST ----- Please call patient and advise him that his recent EMG and nerve conduction velocity test which is the electrical nerve and muscle test he had through our office showed evidence of carpal tunnel on both sides. It may help to use a wrist splint especially at night.  He can buy a wrist support splint over-the-counter.  In addition, there was evidence of irritation of the muscle fibers, pointing towards a possible underlying muscle disease. The potential diagnoses possibilities are quite vast. It may help to pursue a muscle biopsy next.  We usually refer to neurosurgery for this.  I believe he had a discussion with Dr. Leta Baptist who did the EMG at the time as well.   I would be happy to place a referral to neurosurgery if he is okay with pursuing evaluation. The test did not indicate any widespread nerve damage or neuropathy.  Please let me know if he is okay with pursuing the neurosurgery referral for muscle biopsy.

## 2021-12-03 NOTE — Telephone Encounter (Signed)
Spoke with patient and discussed the message below in its entirety from Dr Rexene Alberts.  The patient verbalized understanding.  He said he was not surprised about the carpal tunnel.  He said he would like to discuss this at his next appointment with Dr. Joylene Draft, primary care, which will be next Tuesday.  He would like the results sent to Dr. Joylene Draft.  He also mentioned that every night he sleeps on his left side, always has, and when he wakes up he gets tingling in his first second and third fingers.  Also notes he has pain in his shoulder sometimes but the symptoms dissipate within 1 to 2 hours.  He states he has gone back to lifting light weights and he has not noticed any difference left to right in strength or dexterity.  He verbalized appreciation for the call and he will get back to [place] after his appointment with Dr. Joylene Draft.   Results sent to PCP.

## 2021-12-05 NOTE — Progress Notes (Signed)
Interstitial Lung Disease Multidisciplinary Conference   LAYMAN GULLY    MRN [id number]    DOB 05/19/1946  Primary Care Physician:Perini, Elta [place], MD  Referring Physician: Ann Lions  Time of Conference: 7.30am- 8.30am Date of conference: 11/24/21 Location of Conference: -  Virtual  Participating Pulmonary: Dr. 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 Has 30# weight loss unexlained. CT Shows 2022 ILD  (indet pattern) but to MR there is barely any ILD.  -What about lung images 2021?  "  Serology:   Latest Reference Range & Units 09/17/21 11:55 10/22/21 15:42  Anti Nuclear Antibody (ANA) Negative  Negative   Cyclic Citrullin Peptide Ab UNITS  <16  ds DNA Ab IU/mL  2  RA Latex Turbid. <14.0 IU/mL <10.0     MDD discussion of CT scan    - Date or time period of scan: HRCT: 09/21/2021  - Features mentioned:  - Miscellaneous Comments - Per Dr Laqueta Carina -some irregular very minimal peripheral interstial abnormalities.  no other involvement. 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 No specific ILD. Likely post inflm bland scarring. Has pulm hyperinflation but no empyhysema. NO evidence of asbesos exposure  - What is the final conclusion per 2018 ATS/Fleischner Criteria - ILA (not ILD)  Pathology discussion of biopsy none:  PFTs: pending  Labs: x  MDD Impression/Recs: Discordant read on CT from pfficial report and MDD . MDD is ILA. Final clinical impresion I sILA. Best is to watch   Time Spent in preparation and discussion:  > 30 min    SIGNATURE   Dr. Brand Males, M.D., F.C.C.P,  Pulmonary and Critical Care Medicine Staff Physician, [place] Director - Interstitial Lung Disease  Program  P[facility] [place],  [place], [address]  Pager: [phone], If no answer or between  15:00h - 7:00h: call [phone] Telephone: [phone]  11:34 AM 12/05/2021 ...................................................................................................................Marland Kitchen References: Diagnosis of Hypersensitivity Pneumonitis in Adults. An Official ATS/JRS/ALAT Clinical Practice Guideline. Ragu G et al, [place] Aug 1;202(3):e36-e69.       Diagnosis of Idiopathic Pulmonary Fibrosis. An Official ATS/ERS/JRS/ALAT Clinical Practice Guideline. Raghu G et al, [place]. 2018 Sep 1;198(5):e44-e68.   IPF Suspected   Histopath ology Pattern      UIP  Probable UIP  Indeterminate for  UIP  Alternative  diagnosis    UIP  IPF  IPF  IPF  Non-IPF dx   HRCT   Probabe UIP  IPF  IPF  IPF (Likely)**  Non-IPF dx  Pattern  Indeterminate for UIP  IPF  IPF (Likely)**  Indeterminate  for IPF**  Non-IPF dx    Alternative diagnosis  IPF (Likely)**/ non-IPF dx  Non-IPF dx  Non-IPF dx  Non-IPF dx     Idiopathic pulmonary fibrosis diagnosis based upon HRCT and Biopsy paterns.  ** IPF is the likely diagnosis when any of following features are present:  Moderate-to-severe traction bronchiectasis/bronchiolectasis (defined as mild traction bronchiectasis/bronchiolectasis in four or more lobes including the lingual as a lobe, or moderate to severe traction bronchiectasis in two or more lobes) in a man over age 29 years or in a woman over age 1 years Extensive (>  30%) reticulation on HRCT and an age >70 years  Increased neutrophils and/or absence of lymphocytosis in BAL fluid  Multidisciplinary discussion reaches a confident diagnosis of IPF.   **Indeterminate for IPF  Without an adequate biopsy is unlikely to be IPF  With an adequate biopsy may be reclassified to a more specific diagnosis after multidisciplinary discussion and/or additional consultation.   dx  = diagnosis; HRCT = high-resolution computed tomography; IPF = idiopathic pulmonary fibrosis; UIP = usual interstitial pneumonia.

## 2021-12-07 DIAGNOSIS — K8689 Other specified diseases of pancreas: Secondary | ICD-10-CM | POA: Diagnosis not present

## 2021-12-07 DIAGNOSIS — N3281 Overactive bladder: Secondary | ICD-10-CM | POA: Diagnosis not present

## 2021-12-07 DIAGNOSIS — E785 Hyperlipidemia, unspecified: Secondary | ICD-10-CM | POA: Diagnosis not present

## 2021-12-07 DIAGNOSIS — J849 Interstitial pulmonary disease, unspecified: Secondary | ICD-10-CM | POA: Diagnosis not present

## 2021-12-07 DIAGNOSIS — R634 Abnormal weight loss: Secondary | ICD-10-CM | POA: Diagnosis not present

## 2021-12-07 DIAGNOSIS — M6281 Muscle weakness (generalized): Secondary | ICD-10-CM | POA: Diagnosis not present

## 2021-12-07 DIAGNOSIS — I10 Essential (primary) hypertension: Secondary | ICD-10-CM | POA: Diagnosis not present

## 2021-12-07 DIAGNOSIS — R7301 Impaired fasting glucose: Secondary | ICD-10-CM | POA: Diagnosis not present

## 2021-12-10 ENCOUNTER — Other Ambulatory Visit: Payer: Self-pay

## 2021-12-10 ENCOUNTER — Ambulatory Visit: Payer: PPO | Admitting: Internal Medicine

## 2021-12-10 ENCOUNTER — Ambulatory Visit (INDEPENDENT_AMBULATORY_CARE_PROVIDER_SITE_OTHER): Payer: PPO | Admitting: Internal Medicine

## 2021-12-10 ENCOUNTER — Encounter: Payer: Self-pay | Admitting: Internal Medicine

## 2021-12-10 VITALS — BP 122/78 | HR 74 | Temp 98.4°F | Ht 64.0 in | Wt 144.2 lb

## 2021-12-10 DIAGNOSIS — D802 Selective deficiency of immunoglobulin A [IgA]: Secondary | ICD-10-CM

## 2021-12-10 DIAGNOSIS — J849 Interstitial pulmonary disease, unspecified: Secondary | ICD-10-CM

## 2021-12-10 DIAGNOSIS — R918 Other nonspecific abnormal finding of lung field: Secondary | ICD-10-CM

## 2021-12-10 LAB — PULMONARY FUNCTION TEST
DL/VA % pred: 95 %
DL/VA: 3.91 ml/min/mmHg/L
DLCO cor % pred: 126 %
DLCO cor: 26.63 ml/min/mmHg
DLCO unc % pred: 126 %
DLCO unc: 26.63 ml/min/mmHg
FEF 25-75 Post: 3.86 L/sec
FEF 25-75 Pre: 3.14 L/sec
FEF2575-%Change-Post: 22 %
FEF2575-%Pred-Post: 215 %
FEF2575-%Pred-Pre: 175 %
FEV1-%Change-Post: 5 %
FEV1-%Pred-Post: 150 %
FEV1-%Pred-Pre: 142 %
FEV1-Post: 3.63 L
FEV1-Pre: 3.45 L
FEV1FVC-%Change-Post: 1 %
FEV1FVC-%Pred-Pre: 107 %
FEV6-%Change-Post: 1 %
FEV6-%Pred-Post: 142 %
FEV6-%Pred-Pre: 140 %
FEV6-Post: 4.44 L
FEV6-Pre: 4.37 L
FEV6FVC-%Change-Post: -1 %
FEV6FVC-%Pred-Post: 105 %
FEV6FVC-%Pred-Pre: 107 %
FVC-%Change-Post: 3 %
FVC-%Pred-Post: 135 %
FVC-%Pred-Pre: 130 %
FVC-Post: 4.54 L
FVC-Pre: 4.38 L
Post FEV1/FVC ratio: 80 %
Post FEV6/FVC ratio: 98 %
Pre FEV1/FVC ratio: 79 %
Pre FEV6/FVC Ratio: 100 %
RV % pred: 143 %
RV: 3.13 L
TLC % pred: 126 %
TLC: 7.37 L

## 2021-12-10 NOTE — Patient Instructions (Addendum)
ICD-10-CM   ?1. Pulmonary infiltrate  R91.8   ?  ?2. 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  ? ? ?You have pulmoanry infiltrates ?Breathing test and serology is normal ?We expect a benign course ? ? ?Of note your blood work for IgA shows that the IgA level is low.  People with this condition can have repeated infections or GI issues. ? ?Plan ?- can do PFT in 1 year but we took shared decision making to do as needed followup ?-Please discuss IgA low level with PCP Crist Infante, MD ? ? ?Followup ?- as needed ? ? ?

## 2021-12-10 NOTE — Progress Notes (Signed)
OV 10/22/2021  Subjective:  Patient ID: Frank Vance, male , DOB: 11/06/1945 , age 76 y.o. , MRN: [id number] , ADDRESS: [place] [address] PCP Crist Infante, MD Patient Care Team: Crist Infante, MD as PCP - General (Internal Medicine)  This Provider for this visit: Treatment Team:  Attending Provider: Brand Males, MD    10/22/2021 -   Chief Complaint  Patient presents with   Consult    Pt here for new ILD eval. Pt denies any complaints of cough, SOB, chest tightness. States that he has had unexplained weight loss.     HPI Frank Vance 76 y.o. -new consult evaluation referred by [place] and Dr. Ileana Ladd.  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  He tells me that over the last couple of years after right hip replacement he started losing weight abnormally.  He lost from 160 pounds to 130 pounds.  The weight loss is continued up until 3 weeks ago when it is stabilized.  Extensive work-up so far has been negative except that he had a CT scan of the chest that suggested ILD and therefore has been referred here.  He believes that he is a high metabolizer.  Even when he was a kid and through his married life he had to eat a lot.  He said that when he was in the [employer] in the 1970s he had 5000 cal or more and he was still losing weight.  He says approximately 3 weeks ago with the help of nutritionist and Specialist he started eating 3000 cal and since then the weight loss is stopped.  He is going to see endocrinologist.  He believes his thyroid function is normal.  He might even gained a few pounds of weight.  His high-resolution CT chest reported as indeterminate for UIP and therefore has been referred here.  [facility] Comprehensive ILD Questionnaire  Symptoms: Can SYMPTOM SCALE - ILD 10/22/2021  Current weight   O2 use ra  Shortness of Breath  0 -> 5 scale with 5 being worst (score 6 If unable to do)  At rest 0  Simple tasks - showers, clothes change, eating, shaving 0  Household (dishes, doing bed, laundry) 1  Shopping   Walking level at own pace 1  Walking up Stairs 1  Total (30-36) Dyspnea Score 3  How bad is your cough? 2  How bad is your fatigue 00  How bad is nausea 0  How bad is vomiting?  0  How bad is diarrhea? 0  How bad is anxiety? 0  How bad is depression 0  Any chronic pain - if so where and how bad -Chronic back and neck pain and in the hips.  Pain scale 4-5 but he still is able to play golf.       Past Medical History :  -Idiopathic unexplained weight loss extensive work-up so far negative.  Has seen GI.  Awaiting to see endocrinology -HIV negative, rheumatoid factor and ANA negative, CK negative [-Denies collagen vascular disease - Status post right hip replacement 2 years ago - Severe chronic back and neck pain x40 years - By history thyroid is normal - Has had COVID-vaccine but has not had COVID   ROS:  -Has fatigue and unexplained weight loss - Has neck back and lower hip pain but no shoulder pain or knee pain  or wrist pain or finger joint pain - Close to 30 pound weight loss in the last 2 years - No acid reflux.  No snoring no rash  FAMILY HISTORY of LUNG DISEASE:  -No family history of pulmonary disease  PERSONAL EXPOSURE HISTORY:  -Between 1971 and 1970 03/1976 was in the [employer] in the [employer] in college terms.  Never smoked.  In the remote past occasional use of marijuana but no cocaine or intravenous drug use  HOME  EXPOSURE and HOBBY DETAILS :  -[place] the last 2 years.  The house is 74 years old.  Previously lived in a home built in 1964 for 10 years.  Extensive organic antigen history is negative.  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  But most of the time farm hands are doing the work.  He wore  a down ski jacket many years ago.  He has a Customer service manager and occasionally uses it once a month.  OCCUPATIONAL HISTORY (122 questions) :  -He is an Forensic psychologist.  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  Exposed a lot of asbestos and painting solvents.  Did not wear gloves did not wear a mask.  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  I personally visualized this and also the lung images from an abdominal CT in 2021.  I am barely able to elucidate or visualize any ILD here.   Simple office walk 185 feet x  3 laps goal with forehead probe 10/22/2021    O2 used ra   Number laps completed 3   Comments about pace brisk   Resting Pulse Ox/HR 100% and 76/min   Final Pulse Ox/HR 99% and 94/min   Desaturated </= 88% no   Desaturated <= 3% points no   Got Tachycardic >/= 90/min yes   Symptoms at end of test No symptoms   Miscellaneous comments x       CT Chest data - HRCT Dec 2022  Narrative & Impression  CLINICAL DATA:  Abnormal lung findings on CT chest with possible interstitial lung disease.   EXAM: CT CHEST WITHOUT CONTRAST   TECHNIQUE: Multidetector CT imaging of the chest was performed following the standard protocol without intravenous contrast. High resolution imaging of the lungs, as well as inspiratory and expiratory imaging, was performed.   COMPARISON:  08/25/2021 and 08/25/2020.   FINDINGS: Cardiovascular: Atherosclerotic calcification of the aorta, aortic valve and coronary arteries. Ascending aorta measures up to 4.2 cm. Heart size normal. No pericardial effusion.   Mediastinum/Nodes: Mediastinal lymph nodes are not enlarged by CT size criteria. Hilar regions  are difficult to evaluate without IV contrast. No axillary adenopathy. Esophagus is grossly unremarkable.   Lungs/Pleura: Subtle areas of subpleural ground-glass with minimal subpleural reticular densities and no definite zonal predominance. No definitive traction bronchiectasis/bronchiolectasis, architectural distortion or honeycombing. No pleural fluid. Airway is unremarkable. Minimal air trapping.   Upper Abdomen: Possible subcentimeter subtle low-attenuation lesion in the left hepatic lobe, 7 mm (2/143), too small to characterize. Visualized portions of the liver, visualized portions of the liver and gallbladder are otherwise unremarkable. Fluid  density nodule in the right adrenal gland measures 2.4 cm and in the left adrenal gland, up to 2.2 cm. Subcentimeter low-attenuation lesion in the right kidney is too small to characterize. Right renal artery calcification. Visualized portion of the left kidney, spleen, pancreas, stomach and bowel are grossly unremarkable. Atherosclerotic calcification of the aorta.   Musculoskeletal: Degenerative changes in the spine. No worrisome lytic or sclerotic lesions.   IMPRESSION: 1. 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 Findings are indeterminate for UIP per consensus guidelines: Diagnosis of Idiopathic Pulmonary Fibrosis: An Official ATS/ERS/JRS/ALAT Clinical Practice Guideline. [place], Iss 5, [phone], Jun 11 2017. 2. Air trapping is indicative of small airways disease. 3. Ascending aortic aneurysm. Recommend follow-up every 12 months and vascular consultation. This recommendation follows ACR consensus guidelines: White Paper of the ACR Incidental Findings Committee II on Vascular Findings. J Am Coll Radiol 2013; 10:789-794. 4. Bilateral adrenal adenomas. 5. Aortic atherosclerosis (ICD10-I70.0). Coronary artery calcification.     Electronically Signed   By: Lorin Picket M.D.   On: 09/22/2021 13:38    No results found.   Latest Reference Range & Units 09/17/21 11:55  AST 0 - 40 IU/L 19  ALT 0 - 44 IU/L 18    Latest Reference Range & Units 08/25/20 07:54 09/17/21 11:55  Creatinine 0.76 - 1.27 mg/dL 0.70 0.74 (L)  (L): Data is abnormally low   Latest Reference Range & Units 09/17/21 11:55  CK Total 41 - 331 U/L 55     Interstitial Lung Disease Multidisciplinary Conference   Frank Vance    MRN [id number]    DOB 1946-08-17  Primary Care Physician:Perini, Elta Guadeloupe, MD  Referring Physician: Ann Lions  Time of Conference: 7.30am- 8.30am Date of conference: 11/24/21 Location of Conference: -  Virtual  Participating Pulmonary: Dr. 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 Has 30# weight loss unexlained. CT Shows 2022 ILD  (indet pattern) but to MR there is barely any ILD.  -What about lung images 2021?  "  Serology:   Latest Reference Range & Units 09/17/21 11:55 10/22/21 15:42  Anti Nuclear Antibody (ANA) Negative  Negative   Cyclic Citrullin Peptide Ab UNITS  <16  ds DNA Ab IU/mL  2  RA Latex Turbid. <14.0 IU/mL <10.0    xxxxxxxxxxxxxxxxxxxxxxxxxxxxxxxxxxxxxxxxxxxxxxxxxxxxxxxxxxxxxxxx[id number] MDD discussion of CT scan    - Date or time period of scan: HRCT: 09/21/2021  - Features mentioned:  - Miscellaneous Comments - Per Dr Laqueta Carina -some irregular very minimal peripheral interstial abnormalities.  no other involvement. 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 No specific ILD. Likely post inflm bland scarring. Has pulm hyperinflation but no empyhysema. NO evidence of asbesos exposure  - What is the final conclusion per 2018 ATS/Fleischner Criteria - ILA (not ILD)  Pathology discussion of biopsy none:  PFTs: pending  Labs: x  MDD  Impression/Recs: Discordant read on CT from pfficial report and MDD . 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 Final clinical impresion I sILA. Best is to watch   Time Spent in preparation and discussion:  > 30 min    SIGNATURE   Dr. Brand Males, M.D., F.C.C.P,  Pulmonary and Critical Care Medicine Staff Physician, [place] Director - Interstitial Lung Disease  Program  [facility] -  [place] [facility] [place], [place], [address]  Pager: [phone], If no answer or between  15:00h - 7:00h: call [phone] Telephone: [phone]  11:34 AM 12/05/2021 ...........................................  OV 12/10/2021  Subjective:  Patient ID: Frank Vance, male , DOB: 1946-10-10 , age 50 y.o. , MRN: [id number] , ADDRESS: [place] [address] PCP Crist Infante, MD Patient Care Team: Crist Infante, MD as PCP - General (Internal Medicine)  This Provider for this visit: Treatment Team:  Attending Provider: Brand Males, MD    12/10/2021 -   Chief Complaint  Patient presents with   Follow-up    PFT performed today.  Pt states he has been doing okay since last visit and denies any complaints.     HPI Frank Vance 76 y.o. -presents for follow-up.  He tells me that he is doing well.  He believes he has high metabolic rate.  He is now eating 3000 cal and he has gained his weight.  No respiratory symptoms he works out regularly.  His pulmonary function test is normal.  His serology is normal.  His QuantiFERON gold is normal.  Review of the labs indicate low IgA in December 2022 ordered by someone else.  I shared this result with him.  He was not aware of this.  He asked me to send the result to his primary care physician.  At case conference he only has interstitial lung abnormalities.  The course of this is largely benign.  We discussed about doing pulmonary function test in 1 year but he opted to have open-ended follow-up.    CT  Chest data  No results found.    PFT  PFT Results Latest Ref Rng & Units 12/10/2021  FVC-Pre L 4.38  FVC-Predicted Pre % 130  FVC-Post L 4.54  FVC-Predicted Post % 135  Pre FEV1/FVC % % 79  Post FEV1/FCV % % 80  FEV1-Pre L 3.45  FEV1-Predicted Pre % 142  FEV1-Post L 3.63  DLCO uncorrected ml/min/mmHg 26.63  DLCO UNC% % 126  DLCO corrected ml/min/mmHg 26.63  DLCO COR %Predicted % 126  DLVA Predicted % 95  TLC L 7.37  TLC % Predicted % 126  RV % Predicted % 143     Latest Reference Range & Units 09/17/21 11:55 10/22/21 15:42  Sed Rate 0 - 30 mm/hr 11   Glucose 70 - 99 mg/dL 87   Please Note (HCV):  Comment   Anti Nuclear Antibody (ANA) Negative  Negative   Cyclic Citrullin Peptide Ab UNITS  <16  ds DNA Ab IU/mL  2  RA Latex Turbid. <14.0 IU/mL <10.0   IgG (Immunoglobin G), Serum 603 - 1,613 mg/dL 800   IgM (Immunoglobulin M), Srm 15 - 143 mg/dL 79   IgA/Immunoglobulin A, Serum 61 - 437 mg/dL 43 (L)   [place] Total Antibody EIA Negative  Negative   SSA (Ro) (ENA) Antibody, IgG <1.0 NEG AI  <1.0 NEG  SSB (La) (ENA) Antibody, IgG <1.0 NEG AI  <1.0 NEG  QUANTIFERON-TB GOLD PLUS   Rpt  (L): Data is abnormally low Rpt: View report in Results Review for more information   has a past medical history of Adenomatous polyp of colon (06/20/2003), BPH (benign prostatic hypertrophy), DDD (degenerative disc disease), Diverticulosis, ED (erectile dysfunction), Hyperlipidemia, Hypertension, and Osteoarthritis.   reports that he has never smoked. He has never used smokeless tobacco.  Past Surgical History:  Procedure Laterality Date  CERVICAL FUSION  1997   LUMBAR LAMINECTOMY     REPLANTATION FINGER Right    TONSILLECTOMY     TOTAL HIP ARTHROPLASTY Right     Allergies  Allergen Reactions   Statins     Immunization History  Administered Date(s) Administered   Influenza Split 12/19/2009, 02/04/2010, 03/17/2011, 09/17/2011, 06/29/2012, 10/11/2012   Influenza, High Dose Seasonal  PF 07/11/2021   Influenza, Quadrivalent, Recombinant, Inj, Pf 07/18/2018, 07/15/2020   Influenza,inj,Quad PF,6+ Mos 07/13/2013   Influenza,inj,quad, With Preservative 07/04/2019   PFIZER(Purple Top)SARS-COV-2 Vaccination 12/03/2019, 12/25/2019, 09/11/2020, 05/27/2021   Pneumococcal Conjugate-13 02/05/2015   Tdap 08/01/2013   Zoster, Live 01/08/2009    Family History  Problem Relation Age of Onset   Alzheimer's disease Mother    Cancer Father        bile duct   Diabetes Maternal Uncle    Diabetes Maternal Aunt    Colon cancer Neg Hx    Esophageal cancer Neg Hx    Rectal cancer Neg Hx    Stomach cancer Neg Hx      Current Outpatient Medications:    DULoxetine (CYMBALTA) 30 MG capsule, Take 30 mg by mouth daily., Disp: , Rfl:    ibandronate (BONIVA) 150 MG tablet, Take 1 tablet by mouth every 30 (thirty) days., Disp: , Rfl:    lisinopril (PRINIVIL,ZESTRIL) 10 MG tablet, Take 10 mg by mouth daily., Disp: , Rfl:    pantoprazole (PROTONIX) 40 MG tablet, Take 1 tablet (40 mg total) by mouth daily., Disp: 90 tablet, Rfl: 3   REPATHA SURECLICK 725 MG/ML SOAJ, Inject into the skin every 14 (fourteen) days., Disp: , Rfl:    sildenafil (REVATIO) 20 MG tablet, Take 20 mg by mouth daily as needed. , Disp: , Rfl:       Objective:   Vitals:   12/10/21 1354  BP: 122/78  Pulse: 74  Temp: 98.4 F (36.9 C)  TempSrc: Oral  SpO2: 96%  Weight: 144 lb 3.2 oz (65.4 kg)  Height: 5\' 4"  (1.626 m)    Estimated body mass index is 24.75 kg/m as calculated from the following:   Height as of this encounter: 5\' 4"  (1.626 m).   Weight as of this encounter: 144 lb 3.2 oz (65.4 kg).  @WEIGHTCHANGE @  [employer]   12/10/21 1354  Weight: 144 lb 3.2 oz (65.4 kg)     Physical Exam   General: No distress. Looks well Neuro: Alert and Oriented x 3. GCS 15. Speech normal Psych: Pleasant Resp:  Barrel Chest - no.  Wheeze - no, Crackles - no, No overt respiratory distress         Assessment:       ICD-10-CM   1. Pulmonary infiltrate  R91.8     2. 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 Pulmonary infiltrate  R91.8     2. 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  People with this condition can have repeated infections or GI issues.  Plan - can do PFT in 1 year but we took shared decision making to do as needed followup -Please discuss IgA low level with PCP Crist Infante, MD  Followup - as needed      SIGNATURE    Dr. Brand Males, M.D., F.C.C.P,  Pulmonary and Critical Care Medicine Staff Physician, [place] Director - Interstitial Lung Disease  Program  [facility] [place] at [place], [place], [address]  Pager: [phone], If no answer or between  15:00h - 7:00h: call [phone] Telephone: [phone]  2:36 PM 12/10/2021

## 2021-12-10 NOTE — Progress Notes (Signed)
PFT done today. 

## 2022-04-26 ENCOUNTER — Other Ambulatory Visit: Payer: Self-pay | Admitting: Gastroenterology

## 2022-04-26 DIAGNOSIS — R634 Abnormal weight loss: Secondary | ICD-10-CM

## 2022-05-11 DIAGNOSIS — Z0000 Encounter for general adult medical examination without abnormal findings: Secondary | ICD-10-CM | POA: Diagnosis not present

## 2022-05-11 DIAGNOSIS — E291 Testicular hypofunction: Secondary | ICD-10-CM | POA: Diagnosis not present

## 2022-05-13 DIAGNOSIS — E291 Testicular hypofunction: Secondary | ICD-10-CM | POA: Diagnosis not present

## 2022-05-13 DIAGNOSIS — Z0000 Encounter for general adult medical examination without abnormal findings: Secondary | ICD-10-CM | POA: Diagnosis not present

## 2022-05-13 DIAGNOSIS — E785 Hyperlipidemia, unspecified: Secondary | ICD-10-CM | POA: Diagnosis not present

## 2022-05-13 DIAGNOSIS — R7301 Impaired fasting glucose: Secondary | ICD-10-CM | POA: Diagnosis not present

## 2022-05-13 DIAGNOSIS — I10 Essential (primary) hypertension: Secondary | ICD-10-CM | POA: Diagnosis not present

## 2022-05-13 DIAGNOSIS — M810 Age-related osteoporosis without current pathological fracture: Secondary | ICD-10-CM | POA: Diagnosis not present

## 2022-05-13 DIAGNOSIS — Z125 Encounter for screening for malignant neoplasm of prostate: Secondary | ICD-10-CM | POA: Diagnosis not present

## 2022-05-13 DIAGNOSIS — R7989 Other specified abnormal findings of blood chemistry: Secondary | ICD-10-CM | POA: Diagnosis not present

## 2022-05-18 DIAGNOSIS — Z1331 Encounter for screening for depression: Secondary | ICD-10-CM | POA: Diagnosis not present

## 2022-05-18 DIAGNOSIS — D72829 Elevated white blood cell count, unspecified: Secondary | ICD-10-CM | POA: Diagnosis not present

## 2022-05-18 DIAGNOSIS — N401 Enlarged prostate with lower urinary tract symptoms: Secondary | ICD-10-CM | POA: Diagnosis not present

## 2022-05-18 DIAGNOSIS — I2510 Atherosclerotic heart disease of native coronary artery without angina pectoris: Secondary | ICD-10-CM | POA: Diagnosis not present

## 2022-05-18 DIAGNOSIS — I77810 Thoracic aortic ectasia: Secondary | ICD-10-CM | POA: Diagnosis not present

## 2022-05-18 DIAGNOSIS — R7301 Impaired fasting glucose: Secondary | ICD-10-CM | POA: Diagnosis not present

## 2022-05-18 DIAGNOSIS — M6250 Muscle wasting and atrophy, not elsewhere classified, unspecified site: Secondary | ICD-10-CM | POA: Diagnosis not present

## 2022-05-18 DIAGNOSIS — M810 Age-related osteoporosis without current pathological fracture: Secondary | ICD-10-CM | POA: Diagnosis not present

## 2022-05-18 DIAGNOSIS — I10 Essential (primary) hypertension: Secondary | ICD-10-CM | POA: Diagnosis not present

## 2022-05-18 DIAGNOSIS — E785 Hyperlipidemia, unspecified: Secondary | ICD-10-CM | POA: Diagnosis not present

## 2022-05-18 DIAGNOSIS — Z0000 Encounter for general adult medical examination without abnormal findings: Secondary | ICD-10-CM | POA: Diagnosis not present

## 2022-05-18 DIAGNOSIS — Z23 Encounter for immunization: Secondary | ICD-10-CM | POA: Diagnosis not present

## 2022-05-18 DIAGNOSIS — R82998 Other abnormal findings in urine: Secondary | ICD-10-CM | POA: Diagnosis not present

## 2022-05-18 DIAGNOSIS — Z1339 Encounter for screening examination for other mental health and behavioral disorders: Secondary | ICD-10-CM | POA: Diagnosis not present

## 2022-06-28 DIAGNOSIS — Z961 Presence of intraocular lens: Secondary | ICD-10-CM | POA: Diagnosis not present

## 2022-06-28 DIAGNOSIS — H524 Presbyopia: Secondary | ICD-10-CM | POA: Diagnosis not present

## 2022-08-02 IMAGING — CT CT CHEST-ABD-PELV W/ CM
3 of 5 series · 15 of 36 positions shown, 17 images · IV contrast (omnipaque)
Comparison: 04/03/2020 calcium score CT. Stone study, low without
report, dated 05/25/2005.

CLINICAL DATA: Weight loss. Change in bowel habits. Abdominal pain.
History of prostatitis and diverticulitis.

EXAM:
CT CHEST, ABDOMEN, AND PELVIS WITH CONTRAST
TECHNIQUE: Multidetector CT imaging of the chest, abdomen and pelvis was
performed following the standard protocol during bolus
administration of intravenous contrast.
CONTRAST:  100mL OMNIPAQUE IOHEXOL 300 MG/ML  SOLN

[Series 2: cap with · axial · 0.96mm/px · z∈[+1064,+1570]mm · 9 of 127 slices shown, 11 images]
[im 13/127  mediastinal]
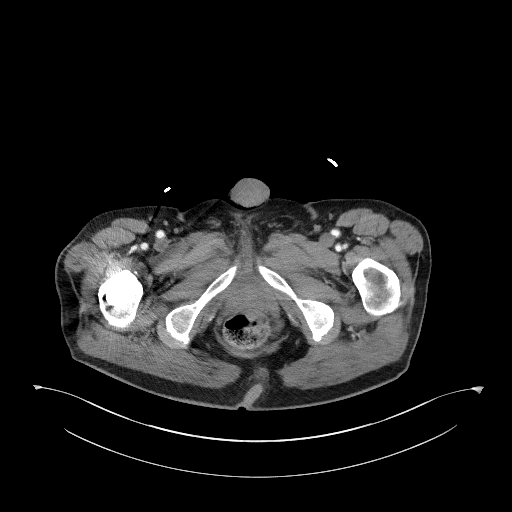
[im 13/127  bone]
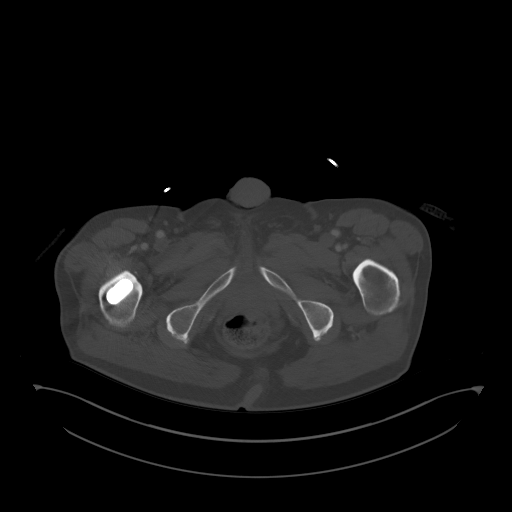
[im 26/127  mediastinal]
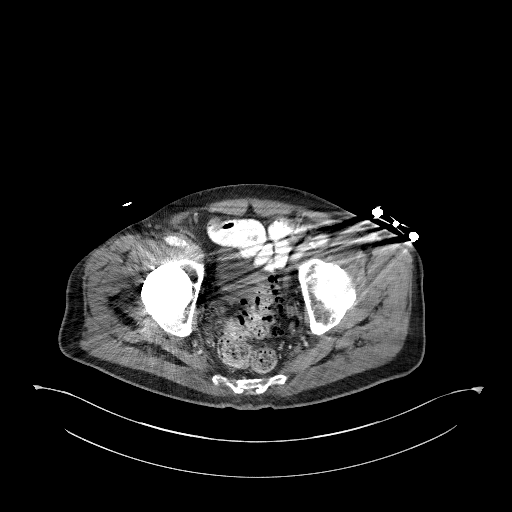
[im 38/127  mediastinal]
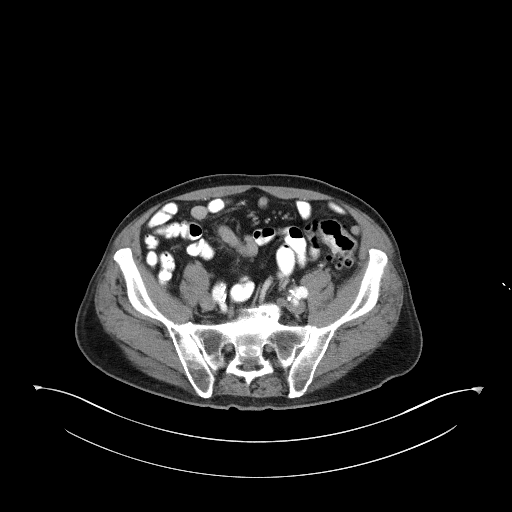
[im 51/127  mediastinal]
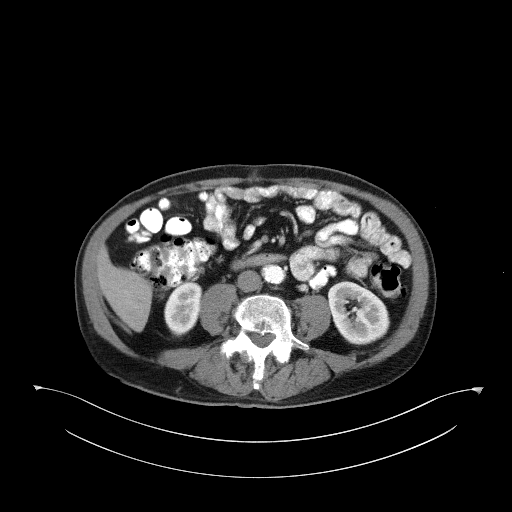
[im 64/127  mediastinal]
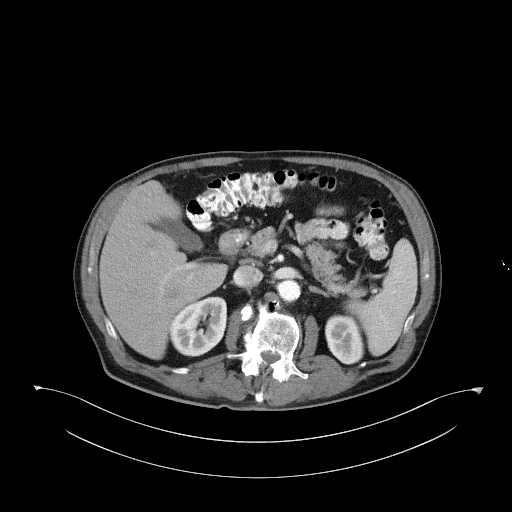
[im 76/127  mediastinal]
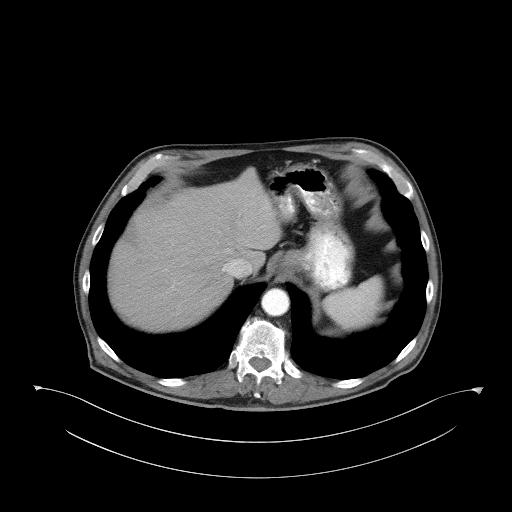
[im 89/127  mediastinal]
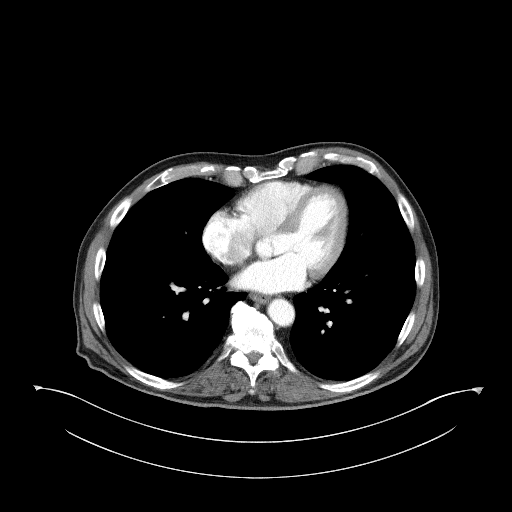
[im 101/127  mediastinal]
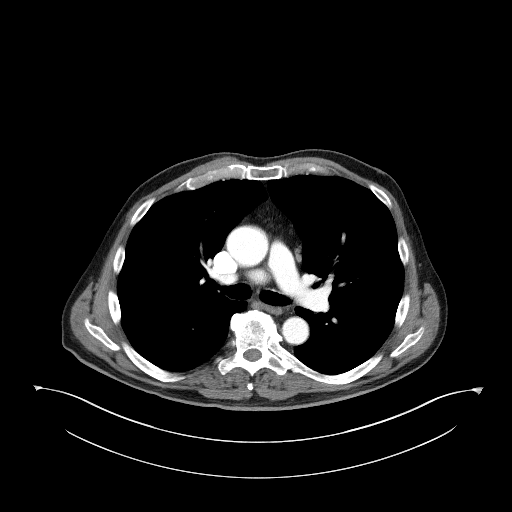
[im 114/127  mediastinal]
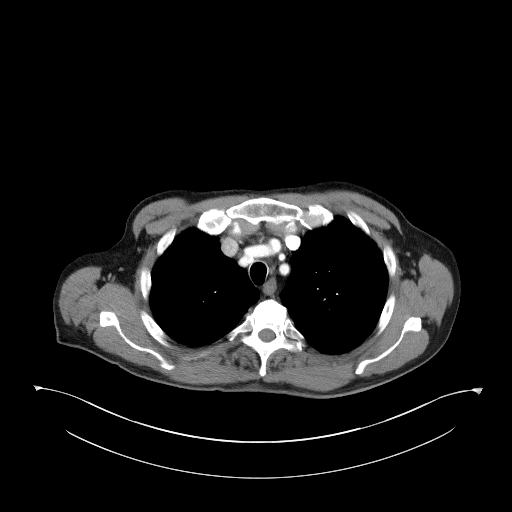
[im 114/127  bone]
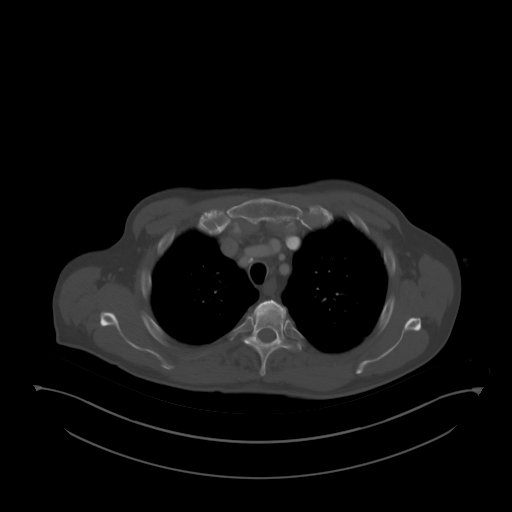

[Series 4: lung · axial · 0.96mm/px · z∈[+1332,+1408]mm · 3 of 164 slices shown]
[im 13/164  bone]
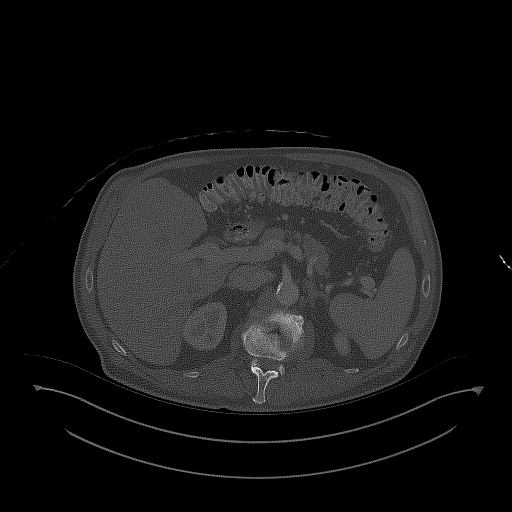
[im 38/164  bone]
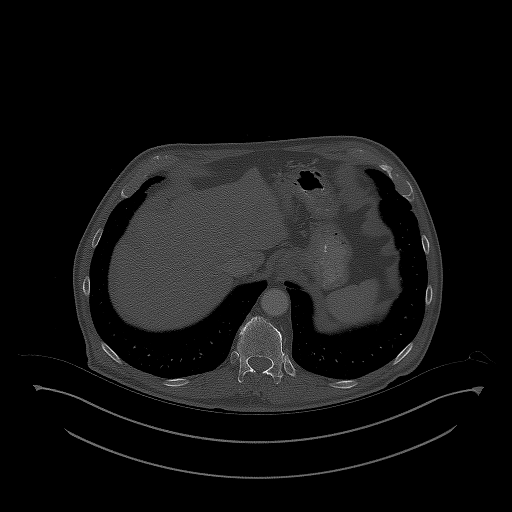
[im 51/164  bone]
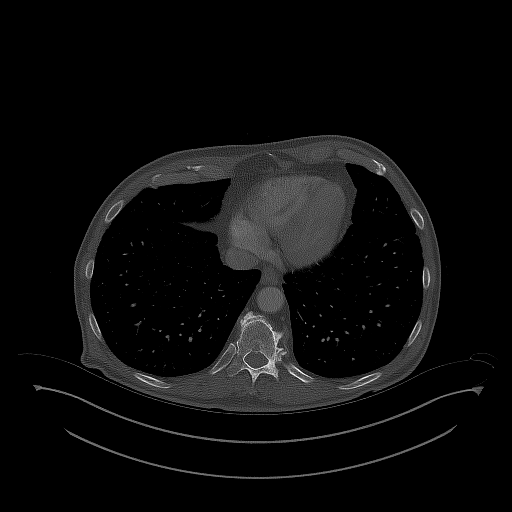

[Series 5: coronals · coronal · 0.82mm/px · 3 of 142 slices shown]
[im 29/142  mediastinal]
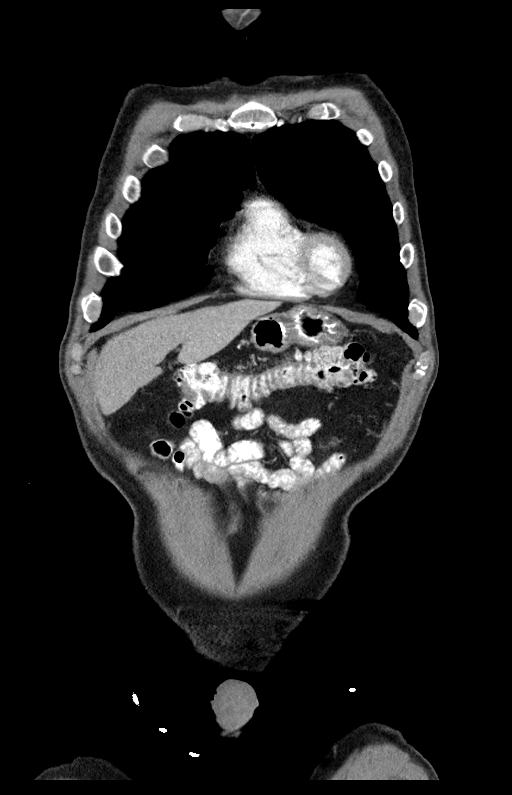
[im 57/142  mediastinal]
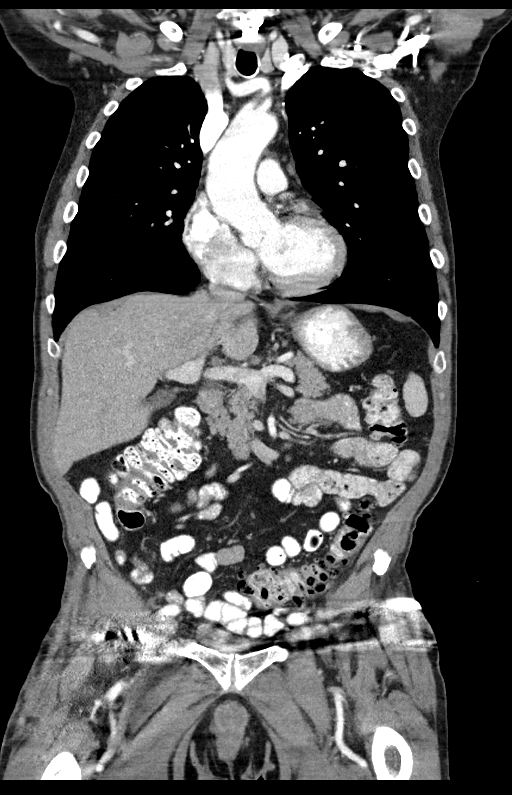
[im 85/142  mediastinal]
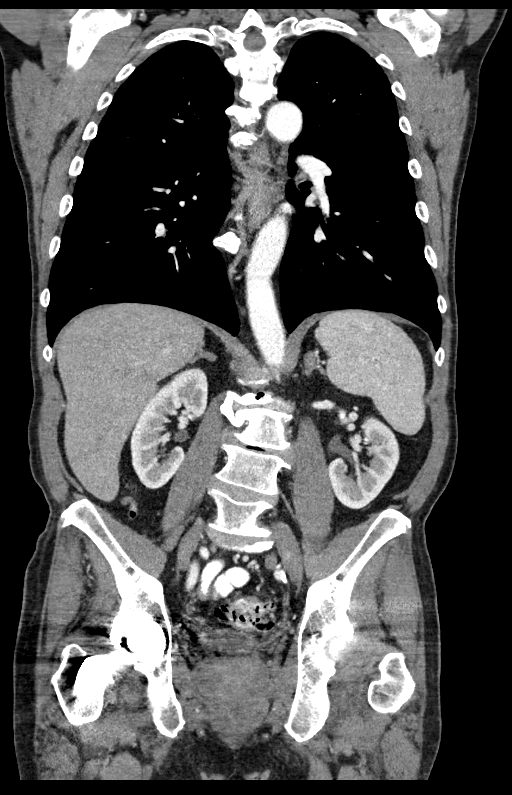

[15 of 36 positions shown; findings below may reference images not displayed]

FINDINGS: CT CHEST FINDINGS

Cardiovascular: Aortic atherosclerosis. Tortuous thoracic aorta.
Borderline cardiomegaly.

Mediastinum/Nodes: No mediastinal or hilar adenopathy.

Lungs/Pleura: No pleural fluid. Left lower lobe calcified granuloma.

Musculoskeletal: Lower cervical spine fixation. No acute osseous
abnormality.

CT ABDOMEN PELVIS FINDINGS

Hepatobiliary: Normal liver. Normal gallbladder, without biliary
ductal dilatation.

Pancreas: Normal, without mass or ductal dilatation.

Spleen: Normal in size, without focal abnormality.

Adrenals/Urinary Tract: Mild left adrenal thickening and nodularity.
Normal right adrenal gland. Bilateral too small to characterize
renal lesions, most likely cysts. Punctate interpolar left renal
collecting system calculus on coronal image 91. No hydronephrosis.
Degraded evaluation of the pelvis, secondary to beam hardening
artifact from right hip arthroplasty. Grossly normal urinary
bladder.

Stomach/Bowel: Normal stomach, without wall thickening. Extensive
colonic diverticulosis. Normal terminal ileum and appendix. Normal
small bowel.

Vascular/Lymphatic: Aortic and branch vessel atherosclerosis. No
abdominal and no gross pelvic sidewall adenopathy.

Reproductive: Mild prostatomegaly.

Other: No significant free fluid.

Musculoskeletal: Right hip arthroplasty. Lumbosacral spondylosis.
Moderate S shaped thoracolumbar spinal curvature. A lucent lesion
within the right iliac at 8 mm on 89/2 was present in 4008 and
considered benign.
IMPRESSION: 1. No acute process or explanation for weight loss.
2. Left nephrolithiasis.
3. Degraded evaluation of the pelvis, secondary to beam hardening
artifact from right hip arthroplasty.
4. Prostatomegaly.
5. Aortic Atherosclerosis ([id number]).

## 2022-12-07 DIAGNOSIS — H7409 Tympanosclerosis, unspecified ear: Secondary | ICD-10-CM | POA: Diagnosis not present

## 2022-12-07 DIAGNOSIS — G720 Drug-induced myopathy: Secondary | ICD-10-CM | POA: Diagnosis not present

## 2022-12-07 DIAGNOSIS — I10 Essential (primary) hypertension: Secondary | ICD-10-CM | POA: Diagnosis not present

## 2022-12-07 DIAGNOSIS — E785 Hyperlipidemia, unspecified: Secondary | ICD-10-CM | POA: Diagnosis not present

## 2022-12-07 DIAGNOSIS — J309 Allergic rhinitis, unspecified: Secondary | ICD-10-CM | POA: Diagnosis not present

## 2022-12-07 DIAGNOSIS — R7301 Impaired fasting glucose: Secondary | ICD-10-CM | POA: Diagnosis not present

## 2022-12-07 DIAGNOSIS — M810 Age-related osteoporosis without current pathological fracture: Secondary | ICD-10-CM | POA: Diagnosis not present

## 2022-12-07 DIAGNOSIS — D72829 Elevated white blood cell count, unspecified: Secondary | ICD-10-CM | POA: Diagnosis not present

## 2022-12-07 DIAGNOSIS — J849 Interstitial pulmonary disease, unspecified: Secondary | ICD-10-CM | POA: Diagnosis not present

## 2022-12-07 DIAGNOSIS — K5790 Diverticulosis of intestine, part unspecified, without perforation or abscess without bleeding: Secondary | ICD-10-CM | POA: Diagnosis not present

## 2022-12-07 DIAGNOSIS — I2510 Atherosclerotic heart disease of native coronary artery without angina pectoris: Secondary | ICD-10-CM | POA: Diagnosis not present

## 2022-12-07 DIAGNOSIS — M6250 Muscle wasting and atrophy, not elsewhere classified, unspecified site: Secondary | ICD-10-CM | POA: Diagnosis not present

## 2023-06-28 DIAGNOSIS — R7301 Impaired fasting glucose: Secondary | ICD-10-CM | POA: Diagnosis not present

## 2023-06-28 DIAGNOSIS — R634 Abnormal weight loss: Secondary | ICD-10-CM | POA: Diagnosis not present

## 2023-06-28 DIAGNOSIS — E291 Testicular hypofunction: Secondary | ICD-10-CM | POA: Diagnosis not present

## 2023-06-28 DIAGNOSIS — N401 Enlarged prostate with lower urinary tract symptoms: Secondary | ICD-10-CM | POA: Diagnosis not present

## 2023-06-28 DIAGNOSIS — Z1212 Encounter for screening for malignant neoplasm of rectum: Secondary | ICD-10-CM | POA: Diagnosis not present

## 2023-06-28 DIAGNOSIS — I10 Essential (primary) hypertension: Secondary | ICD-10-CM | POA: Diagnosis not present

## 2023-06-28 DIAGNOSIS — E785 Hyperlipidemia, unspecified: Secondary | ICD-10-CM | POA: Diagnosis not present

## 2023-07-05 DIAGNOSIS — Z0000 Encounter for general adult medical examination without abnormal findings: Secondary | ICD-10-CM | POA: Diagnosis not present

## 2023-07-05 DIAGNOSIS — E785 Hyperlipidemia, unspecified: Secondary | ICD-10-CM | POA: Diagnosis not present

## 2023-07-05 DIAGNOSIS — Z1212 Encounter for screening for malignant neoplasm of rectum: Secondary | ICD-10-CM | POA: Diagnosis not present

## 2023-07-05 DIAGNOSIS — I700 Atherosclerosis of aorta: Secondary | ICD-10-CM | POA: Diagnosis not present

## 2023-07-05 DIAGNOSIS — I2510 Atherosclerotic heart disease of native coronary artery without angina pectoris: Secondary | ICD-10-CM | POA: Diagnosis not present

## 2023-07-05 DIAGNOSIS — N401 Enlarged prostate with lower urinary tract symptoms: Secondary | ICD-10-CM | POA: Diagnosis not present

## 2023-07-05 DIAGNOSIS — I10 Essential (primary) hypertension: Secondary | ICD-10-CM | POA: Diagnosis not present

## 2023-07-05 DIAGNOSIS — H7409 Tympanosclerosis, unspecified ear: Secondary | ICD-10-CM | POA: Diagnosis not present

## 2023-07-05 DIAGNOSIS — R7301 Impaired fasting glucose: Secondary | ICD-10-CM | POA: Diagnosis not present

## 2023-07-05 DIAGNOSIS — M545 Low back pain, unspecified: Secondary | ICD-10-CM | POA: Diagnosis not present

## 2023-07-05 DIAGNOSIS — Z23 Encounter for immunization: Secondary | ICD-10-CM | POA: Diagnosis not present

## 2023-07-05 DIAGNOSIS — R82998 Other abnormal findings in urine: Secondary | ICD-10-CM | POA: Diagnosis not present

## 2023-07-05 DIAGNOSIS — E291 Testicular hypofunction: Secondary | ICD-10-CM | POA: Diagnosis not present

## 2023-07-05 DIAGNOSIS — M810 Age-related osteoporosis without current pathological fracture: Secondary | ICD-10-CM | POA: Diagnosis not present

## 2023-07-05 DIAGNOSIS — I7121 Aneurysm of the ascending aorta, without rupture: Secondary | ICD-10-CM | POA: Diagnosis not present

## 2023-07-07 ENCOUNTER — Other Ambulatory Visit: Payer: Self-pay | Admitting: Internal Medicine

## 2023-07-07 DIAGNOSIS — M545 Low back pain, unspecified: Secondary | ICD-10-CM

## 2023-07-11 DIAGNOSIS — K921 Melena: Secondary | ICD-10-CM | POA: Diagnosis not present

## 2023-08-01 ENCOUNTER — Ambulatory Visit
Admission: RE | Admit: 2023-08-01 | Discharge: 2023-08-01 | Disposition: A | Discharge status: Home / Self Care | Payer: PPO | Source: Ambulatory Visit | Attending: Internal Medicine | Admitting: Internal Medicine

## 2023-08-01 DIAGNOSIS — M545 Low back pain, unspecified: Secondary | ICD-10-CM

## 2023-08-01 DIAGNOSIS — M48061 Spinal stenosis, lumbar region without neurogenic claudication: Secondary | ICD-10-CM | POA: Diagnosis not present

## 2024-01-18 DIAGNOSIS — N401 Enlarged prostate with lower urinary tract symptoms: Secondary | ICD-10-CM | POA: Diagnosis not present

## 2024-01-18 DIAGNOSIS — K5790 Diverticulosis of intestine, part unspecified, without perforation or abscess without bleeding: Secondary | ICD-10-CM | POA: Diagnosis not present

## 2024-01-18 DIAGNOSIS — E785 Hyperlipidemia, unspecified: Secondary | ICD-10-CM | POA: Diagnosis not present

## 2024-01-18 DIAGNOSIS — R7301 Impaired fasting glucose: Secondary | ICD-10-CM | POA: Diagnosis not present

## 2024-01-18 DIAGNOSIS — I700 Atherosclerosis of aorta: Secondary | ICD-10-CM | POA: Diagnosis not present

## 2024-01-18 DIAGNOSIS — I10 Essential (primary) hypertension: Secondary | ICD-10-CM | POA: Diagnosis not present

## 2024-01-18 DIAGNOSIS — I2510 Atherosclerotic heart disease of native coronary artery without angina pectoris: Secondary | ICD-10-CM | POA: Diagnosis not present

## 2024-01-18 DIAGNOSIS — J849 Interstitial pulmonary disease, unspecified: Secondary | ICD-10-CM | POA: Diagnosis not present

## 2024-01-18 DIAGNOSIS — I7121 Aneurysm of the ascending aorta, without rupture: Secondary | ICD-10-CM | POA: Diagnosis not present

## 2024-01-18 DIAGNOSIS — M810 Age-related osteoporosis without current pathological fracture: Secondary | ICD-10-CM | POA: Diagnosis not present

## 2024-01-18 DIAGNOSIS — N529 Male erectile dysfunction, unspecified: Secondary | ICD-10-CM | POA: Diagnosis not present

## 2024-01-18 DIAGNOSIS — J984 Other disorders of lung: Secondary | ICD-10-CM | POA: Diagnosis not present

## 2024-01-19 DIAGNOSIS — M25551 Pain in right hip: Secondary | ICD-10-CM | POA: Diagnosis not present

## 2024-02-15 DIAGNOSIS — H5203 Hypermetropia, bilateral: Secondary | ICD-10-CM | POA: Diagnosis not present

## 2024-02-15 DIAGNOSIS — Z961 Presence of intraocular lens: Secondary | ICD-10-CM | POA: Diagnosis not present

## 2024-02-15 DIAGNOSIS — H52203 Unspecified astigmatism, bilateral: Secondary | ICD-10-CM | POA: Diagnosis not present

## 2024-02-15 DIAGNOSIS — H47093 Other disorders of optic nerve, not elsewhere classified, bilateral: Secondary | ICD-10-CM | POA: Diagnosis not present

## 2024-02-15 DIAGNOSIS — H04123 Dry eye syndrome of bilateral lacrimal glands: Secondary | ICD-10-CM | POA: Diagnosis not present

## 2024-02-15 DIAGNOSIS — H40002 Preglaucoma, unspecified, left eye: Secondary | ICD-10-CM | POA: Diagnosis not present

## 2024-02-15 DIAGNOSIS — H524 Presbyopia: Secondary | ICD-10-CM | POA: Diagnosis not present

## 2024-07-17 DIAGNOSIS — H903 Sensorineural hearing loss, bilateral: Secondary | ICD-10-CM | POA: Diagnosis not present

## 2024-09-11 DIAGNOSIS — E785 Hyperlipidemia, unspecified: Secondary | ICD-10-CM | POA: Diagnosis not present

## 2024-09-11 DIAGNOSIS — Z1212 Encounter for screening for malignant neoplasm of rectum: Secondary | ICD-10-CM | POA: Diagnosis not present

## 2024-09-11 DIAGNOSIS — M810 Age-related osteoporosis without current pathological fracture: Secondary | ICD-10-CM | POA: Diagnosis not present

## 2024-09-11 DIAGNOSIS — E291 Testicular hypofunction: Secondary | ICD-10-CM | POA: Diagnosis not present

## 2024-09-19 ENCOUNTER — Ambulatory Visit: Payer: Self-pay | Admitting: Surgery

## 2024-09-19 ENCOUNTER — Other Ambulatory Visit (HOSPITAL_COMMUNITY): Payer: Self-pay | Admitting: Internal Medicine

## 2024-09-19 DIAGNOSIS — I77810 Thoracic aortic ectasia: Secondary | ICD-10-CM

## 2024-09-19 DIAGNOSIS — K4090 Unilateral inguinal hernia, without obstruction or gangrene, not specified as recurrent: Secondary | ICD-10-CM | POA: Diagnosis not present

## 2024-09-19 NOTE — Progress Notes (Signed)
 Frank Vance   Referring Provider:  Sammi Laymon BIRCH,*   Subjective   Chief Complaint: New Consultation     History of Present Illness:    37wo man with history of BPH, DDD, diverticulosis, HTN, HLD, arthritis, prior right hip replacement as well as c-spine and l-spine surgery and chronic pain, no previous abdominal surgery presents for evaluation of a right inguinal hernia.  He notes about a 1 week history of noting a protrusion in the right groin which is more prominent with activity and subsides at night.  It has progressively worsened and is most uncomfortable in the afternoon.  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 He notes that his late wife passed away from complications of what sounds like a strangulated hernia.   Review of Systems: A complete review of systems was obtained from the patient.  I have reviewed this information and discussed as appropriate with the patient.  See HPI as well for other ROS.   Medical History: History reviewed. No pertinent past medical history.  There is no problem list on file for this patient.   History reviewed. No pertinent surgical history.   Allergies  Allergen Reactions   Duloxetine Other (See Comments)    Other Reaction(s): drowsy on it   Pneumococcal 7-Val Conj Vacc Swelling   Statins-Hmg-Coa Reductase Inhibitors Other (See Comments)    Other Reaction(s): Leg cramps    Current Outpatient Medications on File Prior to Visit  Medication Sig Dispense Refill   lisinopriL (ZESTRIL) 10 MG tablet Take 10 mg by mouth once daily     REPATHA SURECLICK 140 mg/mL PnIj INJECT 1 INJECTION SUBCUTANEOUSLY EVERY 2 WEEKS AS DIRECTED.     No current facility-administered medications on file prior to visit.    History reviewed. No pertinent family history.   Social History   Tobacco Use  Smoking Status Never  Smokeless Tobacco Never     Social History    Socioeconomic History   Marital status: Widowed  Tobacco Use   Smoking status: Never   Smokeless tobacco: Never  Vaping Use   Vaping status: Never Used  Substance and Sexual Activity   Alcohol  use: Yes    Alcohol /week: 0.0 - 1.0 standard drinks of alcohol    Drug use: Never   Social Drivers of Health   Housing Stability: Unknown (09/19/2024)   Housing Stability Vital Sign    Homeless in the Last Year: No    Objective:    Vitals:   09/19/24 1524  BP: (!) 160/96  Pulse: 93  Temp: 37.1 C (98.7 F)  SpO2: 96%  Weight: 66 kg (145 lb 9.6 oz)  Height: 162.6 cm (5' 4)  PainSc: 0-No pain    Body mass index is 24.99 kg/m.  Gen: A&Ox3, no distress  Chest: respiratory effort is normal. Abdomen: soft, nondistended, nontender. Reducible moderate right inguinal hernia. No hernia on the left. Neuro: no gross deficit Psych: appropriate mood and affect, normal insight/judgment intact  Skin: warm and dry   Assessment and Plan:  Diagnoses and all orders for this visit:  Non-recurrent unilateral inguinal hernia without obstruction or gangrene     We discussed the relevant anatomy and we discussed options for repair.  I recommend an open approach and went over the technique of the procedure.  Discussed risks of bleeding, infection, pain, scarring, injury to structures in the area including nerves, blood vessels, bowel,  or bladder; risk of chronic pain, hernia recurrence, risk of seroma or hematoma, urinary retention, and risks of general anesthesia including cardiovascular, pulmonary, and thromboembolic complications.  We discussed typical postop recovery, timeline, and activity limitations.  We also discussed the option of ongoing observation, with high rate of ultimately returning for surgery and risk of increasing size/symptoms from the hernia as well as incarceration/strangulation and went over symptoms that should prompt the patient to seek emergency treatment.  Questions were  welcomed and answered to the patient's satisfaction. Patient wishes to proceed with scheduling.     Mitzie Freund MD FACS

## 2024-09-19 NOTE — H&P (Signed)
 Frank Vance [id number]   Referring Provider:  Sammi Laymon BIRCH,*   Subjective   Chief Complaint: New Consultation     History of Present Illness:    37wo man with history of BPH, DDD, diverticulosis, HTN, HLD, arthritis, prior right hip replacement as well as c-spine and l-spine surgery and chronic pain, no previous abdominal surgery presents for evaluation of a right inguinal hernia.  He notes about a 1 week history of noting a protrusion in the right groin which is more prominent with activity and subsides at night.  It has progressively worsened and is most uncomfortable in the afternoon.  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 He notes that his late wife passed away from complications of what sounds like a strangulated hernia.   Review of Systems: A complete review of systems was obtained from the patient.  I have reviewed this information and discussed as appropriate with the patient.  See HPI as well for other ROS.   Medical History: History reviewed. No pertinent past medical history.  There is no problem list on file for this patient.   History reviewed. No pertinent surgical history.   Allergies  Allergen Reactions   Duloxetine Other (See Comments)    Other Reaction(s): drowsy on it   Pneumococcal 7-Val Conj Vacc Swelling   Statins-Hmg-Coa Reductase Inhibitors Other (See Comments)    Other Reaction(s): Leg cramps    Current Outpatient Medications on File Prior to Visit  Medication Sig Dispense Refill   lisinopriL (ZESTRIL) 10 MG tablet Take 10 mg by mouth once daily     REPATHA SURECLICK 140 mg/mL PnIj INJECT 1 INJECTION SUBCUTANEOUSLY EVERY 2 WEEKS AS DIRECTED.     No current facility-administered medications on file prior to visit.    History reviewed. No pertinent family history.   Social History   Tobacco Use  Smoking Status Never  Smokeless Tobacco Never     Social History    Socioeconomic History   Marital status: Widowed  Tobacco Use   Smoking status: Never   Smokeless tobacco: Never  Vaping Use   Vaping status: Never Used  Substance and Sexual Activity   Alcohol  use: Yes    Alcohol /week: 0.0 - 1.0 standard drinks of alcohol    Drug use: Never   Social Drivers of Health   Housing Stability: Unknown (09/19/2024)   Housing Stability Vital Sign    Homeless in the Last Year: No    Objective:    Vitals:   09/19/24 1524  BP: (!) 160/96  Pulse: 93  Temp: 37.1 C (98.7 F)  SpO2: 96%  Weight: 66 kg (145 lb 9.6 oz)  Height: 162.6 cm (5' 4)  PainSc: 0-No pain    Body mass index is 24.99 kg/m.  Gen: A&Ox3, no distress  Chest: respiratory effort is normal. Abdomen: soft, nondistended, nontender. Reducible moderate right inguinal hernia. No hernia on the left. Neuro: no gross deficit Psych: appropriate mood and affect, normal insight/judgment intact  Skin: warm and dry   Assessment and Plan:  Diagnoses and all orders for this visit:  Non-recurrent unilateral inguinal hernia without obstruction or gangrene     We discussed the relevant anatomy and we discussed options for repair.  I recommend an open approach and went over the technique of the procedure.  Discussed risks of bleeding, infection, pain, scarring, injury to structures in the area including nerves, blood vessels, bowel,  or bladder; risk of chronic pain, hernia recurrence, risk of seroma or hematoma, urinary retention, and risks of general anesthesia including cardiovascular, pulmonary, and thromboembolic complications.  We discussed typical postop recovery, timeline, and activity limitations.  We also discussed the option of ongoing observation, with high rate of ultimately returning for surgery and risk of increasing size/symptoms from the hernia as well as incarceration/strangulation and went over symptoms that should prompt the patient to seek emergency treatment.  Questions were  welcomed and answered to the patient's satisfaction. Patient wishes to proceed with scheduling.     Mitzie Freund MD FACS

## 2024-09-19 NOTE — H&P (View-Only) (Signed)
 Frank Vance [id number]   Referring Provider:  Sammi Laymon BIRCH,*   Subjective   Chief Complaint: New Consultation     History of Present Illness:    37wo man with history of BPH, DDD, diverticulosis, HTN, HLD, arthritis, prior right hip replacement as well as c-spine and l-spine surgery and chronic pain, no previous abdominal surgery presents for evaluation of a right inguinal hernia.  He notes about a 1 week history of noting a protrusion in the right groin which is more prominent with activity and subsides at night.  It has progressively worsened and is most uncomfortable in the afternoon.  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 He notes that his late wife passed away from complications of what sounds like a strangulated hernia.   Review of Systems: A complete review of systems was obtained from the patient.  I have reviewed this information and discussed as appropriate with the patient.  See HPI as well for other ROS.   Medical History: History reviewed. No pertinent past medical history.  There is no problem list on file for this patient.   History reviewed. No pertinent surgical history.   Allergies  Allergen Reactions   Duloxetine Other (See Comments)    Other Reaction(s): drowsy on it   Pneumococcal 7-Val Conj Vacc Swelling   Statins-Hmg-Coa Reductase Inhibitors Other (See Comments)    Other Reaction(s): Leg cramps    Current Outpatient Medications on File Prior to Visit  Medication Sig Dispense Refill   lisinopriL (ZESTRIL) 10 MG tablet Take 10 mg by mouth once daily     REPATHA SURECLICK 140 mg/mL PnIj INJECT 1 INJECTION SUBCUTANEOUSLY EVERY 2 WEEKS AS DIRECTED.     No current facility-administered medications on file prior to visit.    History reviewed. No pertinent family history.   Social History   Tobacco Use  Smoking Status Never  Smokeless Tobacco Never     Social History    Socioeconomic History   Marital status: Widowed  Tobacco Use   Smoking status: Never   Smokeless tobacco: Never  Vaping Use   Vaping status: Never Used  Substance and Sexual Activity   Alcohol  use: Yes    Alcohol /week: 0.0 - 1.0 standard drinks of alcohol    Drug use: Never   Social Drivers of Health   Housing Stability: Unknown (09/19/2024)   Housing Stability Vital Sign    Homeless in the Last Year: No    Objective:    Vitals:   09/19/24 1524  BP: (!) 160/96  Pulse: 93  Temp: 37.1 C (98.7 F)  SpO2: 96%  Weight: 66 kg (145 lb 9.6 oz)  Height: 162.6 cm (5' 4)  PainSc: 0-No pain    Body mass index is 24.99 kg/m.  Gen: A&Ox3, no distress  Chest: respiratory effort is normal. Abdomen: soft, nondistended, nontender. Reducible moderate right inguinal hernia. No hernia on the left. Neuro: no gross deficit Psych: appropriate mood and affect, normal insight/judgment intact  Skin: warm and dry   Assessment and Plan:  Diagnoses and all orders for this visit:  Non-recurrent unilateral inguinal hernia without obstruction or gangrene     We discussed the relevant anatomy and we discussed options for repair.  I recommend an open approach and went over the technique of the procedure.  Discussed risks of bleeding, infection, pain, scarring, injury to structures in the area including nerves, blood vessels, bowel,  or bladder; risk of chronic pain, hernia recurrence, risk of seroma or hematoma, urinary retention, and risks of general anesthesia including cardiovascular, pulmonary, and thromboembolic complications.  We discussed typical postop recovery, timeline, and activity limitations.  We also discussed the option of ongoing observation, with high rate of ultimately returning for surgery and risk of increasing size/symptoms from the hernia as well as incarceration/strangulation and went over symptoms that should prompt the patient to seek emergency treatment.  Questions were  welcomed and answered to the patient's satisfaction. Patient wishes to proceed with scheduling.     Mitzie Freund MD FACS

## 2024-09-20 NOTE — Patient Instructions (Signed)
 SURGICAL WAITING ROOM VISITATION Patients having surgery or a procedure may have no more than 2 support people in the waiting area - these visitors may rotate in the visitor waiting room.   If the patient needs to stay at the hospital during part of their recovery, the visitor guidelines for inpatient rooms apply.  PRE-OP VISITATION  Pre-op nurse will coordinate an appropriate time for 1 support person to accompany the patient in pre-op.  This support person may not rotate.  This visitor will be contacted when the time is appropriate for the visitor to come back in the pre-op area.  To keep our patients, visitors and teammates safe and prevent the spread of respiratory illnesses over the next few months.  Temporary Visitor Restrictions  Children ages 26 and under will not be able to visit patients in [facility] under most circumstances. Visitation is not restricted outside of hospitals unless noted otherwise in the [facility] and Location Specific Visitation Guidelines at:       [email or web address]  Visitors with respiratory illnesses are discouraged from visiting and should remain at home. You are not required to quarantine at this time prior to your surgery. However, you must do this: Hand Hygiene often Do NOT share personal items Notify your provider if you are in close contact with someone who has COVID or you develop fever 100.4 or greater, new onset of sneezing, cough, sore throat, shortness of breath or body aches.  If you test positive for Covid or have been in contact with anyone that has tested positive in the last 10 days please notify you surgeon.    Your procedure is scheduled on: [facility]  09-26-2024   Report to [facility] Main Entrance: [place] entrance where the [employer] is available.   Report to admitting at: 06:15    AM  Call this number if you have any questions or problems the morning of surgery [phone]  DO NOT EAT OR DRINK ANYTHING  AFTER MIDNIGHT THE NIGHT PRIOR TO YOUR SURGERY / PROCEDURE.   FOLLOW  ANY ADDITIONAL PRE OP INSTRUCTIONS YOU RECEIVED FROM YOUR SURGEON'S OFFICE!!!   Oral Hygiene is also important to reduce your risk of infection.        Remember - BRUSH YOUR TEETH THE MORNING OF SURGERY WITH YOUR REGULAR TOOTHPASTE  Do NOT smoke after Midnight the night before surgery.  STOP TAKING all Vitamins, Herbs and supplements 1 week before your surgery.   DO NOT TAKE  Repatha , Boniva prior to your surgery.   Take ONLY these medicines the morning of surgery with A SIP OF WATER:  none  DO NOT TAKE LISINOPRIL the morning of your surgery.                    You may not have any metal on your body including  jewelry, and body piercing  Do not wear lotions, powders,cologne, or deodorant  Men may shave face and neck.  Contacts, Hearing Aids, dentures or bridgework may not be worn into surgery. DENTURES WILL BE REMOVED PRIOR TO SURGERY PLEASE DO NOT APPLY Poly grip OR ADHESIVES!!!  Patients discharged on the day of surgery will not be allowed to drive home.  Someone NEEDS to stay with you for the first 24 hours after anesthesia.  Do not bring your home medications to the hospital. The Pharmacy will dispense medications listed on your medication list to you during your admission in the Hospital.  Special Instructions: Bring a copy  of your healthcare power of attorney and living will documents the day of surgery, if you wish to have them scanned into your [facility] Records- EPIC  Please read over the following fact sheets you were given: IF YOU HAVE QUESTIONS ABOUT YOUR PRE-OP INSTRUCTIONS, PLEASE CALL [phone].   [place] - Preparing for Surgery      Before surgery, you can play an important role.  Because skin is not sterile, your skin needs to be as free of germs as possible.  You can reduce the number of germs on your skin by washing with CHG (chlorahexidine gluconate) soap before surgery.   CHG is an antiseptic cleaner which kills germs and bonds with the skin to continue killing germs even after washing. Please DO NOT use if you have an allergy to CHG or antibacterial soaps.  If your skin becomes reddened/irritated stop using the CHG and inform your nurse when you arrive at Short Stay. Do not shave (including legs and underarms) for at least 48 hours prior to the first CHG shower.  You may shave your face/neck.  Please follow these instructions carefully:  1.  Shower with CHG Soap the night before surgery ONLY (DO NOT USE THE CHG SOAP THE MORNING OF SURGERY).  2.  If you choose to wash your hair, wash your hair first as usual with your normal  shampoo.  3.  After you shampoo, rinse your hair and body thoroughly to remove the shampoo.                             4.  Use CHG as you would any other liquid soap.  You can apply chg directly to the skin and wash.  Gently with a scrungie or clean washcloth.  5.  Apply the CHG Soap to your body ONLY FROM THE NECK DOWN.   Do not use on face/ open                           Wound or open sores. Avoid contact with eyes, ears mouth and genitals (private parts).                       Wash face,  Genitals (private parts) with your normal soap.             6.  Wash thoroughly, paying special attention to the area where your  surgery  will be performed.  7.  Thoroughly rinse your body with warm water from the neck down.  8.  DO NOT shower/wash with your normal soap after using and rinsing off the CHG Soap.                9.  Pat yourself dry with a clean towel.            10.  Wear clean pajamas.            11.  Place clean sheets on your bed the night of your first shower and do not  sleep with pets.  Day of Surgery : Do not apply any CHG, lotions/deodorants the morning of surgery.  Please wear clean clothes to the hospital/surgery center.   FAILURE TO FOLLOW THESE INSTRUCTIONS MAY RESULT IN THE CANCELLATION OF YOUR SURGERY  PATIENT  SIGNATURE_________________________________  NURSE SIGNATURE__________________________________  ________________________________________________________________________

## 2024-09-20 NOTE — Progress Notes (Signed)
 COVID Vaccine received:  []  No [x]  Yes Date of any COVID positive Test in last 90 days:  PCP - Oneil Neth, MD  Cardiologist -  Pulmonology- Dorethia Cave, MD  (LOV 12-10-2021)  Chest x-ray - CT Chest 09-2021  Epic EKG -  PST Stress Test -  ECHO - 09-18-2024  CE  scheduled for 10-31-24  Cardiac Cath -  CT Coronary Calcium score: 115 on 04-03-2020  Epic  Pacemaker / ICD device []  No []  Yes   Spinal Cord Stimulator:[]  No []  Yes       History of Sleep Apnea? []  No []  Yes   CPAP used?- []  No []  Yes    Medication on DOS: none Hold :  Repatha q 14 days , lisinopril,  Boniva  Patient has: [x]  NO Hx DM   []  Pre-DM   []  DM1  []   DM2 Does the patient monitor blood sugar?   [x]  N/A   []  No []  Yes  Last A1c was: 5.6  on  09-11-2024     Blood Thinner / Instructions: Aspirin Instructions:  Activity level: Able to walk up 2 flights of stairs without becoming significantly short of breath or having chest pain?  []  No   []    Yes  Patient can perform ADLs without assistance. []  No   []   Yes  Comments: Patient is a retired Pensions Consultant  Anesthesia review: HTN, BPH, Interstitial lung Disease,  Cervical fusion 1997??  HOH-  Patient denies any S&S of respiratory illness or Covid - no shortness of breath, fever, cough or chest pain at PAT appointment.  Patient verbalized understanding and agreement to the Pre-Surgical Instructions that were given to them at this PAT appointment. Patient was also educated of the need to review these PAT instructions again prior to his/her surgery.I reviewed the appropriate phone numbers to call if they have any and questions or concerns.

## 2024-09-21 ENCOUNTER — Encounter (HOSPITAL_COMMUNITY)
Admission: RE | Admit: 2024-09-21 | Discharge: 2024-09-21 | Disposition: A | Source: Ambulatory Visit | Attending: Surgery

## 2024-09-21 ENCOUNTER — Other Ambulatory Visit: Payer: Self-pay

## 2024-09-21 ENCOUNTER — Encounter (HOSPITAL_COMMUNITY): Payer: Self-pay

## 2024-09-21 VITALS — BP 120/82 | HR 72 | Temp 98.7°F | Resp 14 | Ht 65.0 in | Wt 145.0 lb

## 2024-09-21 DIAGNOSIS — I10 Essential (primary) hypertension: Secondary | ICD-10-CM

## 2024-09-21 DIAGNOSIS — Z01818 Encounter for other preprocedural examination: Secondary | ICD-10-CM

## 2024-09-21 DIAGNOSIS — Z79899 Other long term (current) drug therapy: Secondary | ICD-10-CM

## 2024-09-24 ENCOUNTER — Encounter (HOSPITAL_COMMUNITY): Payer: Self-pay

## 2024-09-24 NOTE — Progress Notes (Signed)
 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  Per Dr. Shayne regarding clearance: We are treating his heart risk factors. he needs to stop the nsaid therapy. but i do consider him fully cleared for hernia repair if needed soon.  Patient has echo ordered for 10/31/2024 to follow-up on his TAA. Per Dr. Shayne:  4.2 cm on 2022 scan. i will do 2d echo now. has never had one and we would like this data and will also check ascending aorta size.   Hx of ILD by imaging however per notes patient is asymptomatic. He has seen pulmonology in the past and discharge with prn f/u. PFTs normal in 2023. Per PCP he will be followed clinically.  At PAT visit patient reports being able to do stairs without chest pain or shortness of breath.  Anticipate he can proceed  VS: BP 120/82 Comment: right arm sitting  Pulse 72   Temp 37.1 C (Oral)   Resp 14   Ht 5' 5 (1.651 m)   Wt 65.8 kg   SpO2 98%   BMI 24.13 kg/m   PROVIDERS: Shayne Anes, MD   LABS: Normal at PCP's office (scanned in media on 12/12)   CT chest 09/21/2021:  IMPRESSION: 1. 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 Findings are indeterminate for UIP per consensus guidelines: Diagnosis of Idiopathic Pulmonary Fibrosis: An Official ATS/ERS/JRS/ALAT Clinical Practice Guideline. Am [name] Vol 198, Iss 5, [phone], Jun 11 2017. 2. 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 3. Ascending aortic aneurysm. Recommend follow-up every 12 months and vascular consultation. This recommendation follows ACR consensus guidelines: White Paper of the ACR Incidental Findings Committee II on Vascular Findings. J Am Coll Radiol 2013; 10:789-794. 4. Bilateral adrenal adenomas. 5. Aortic atherosclerosis (ICD10-I70.0). Coronary artery calcification.  EKG (requested from PCP clinic). Obtain DOS if not received.    CT calcium score 04/03/2020:  IMPRESSION: 1. Three-vessel coronary artery calcification.   2. Total Agatston Score: 115   3. MESA age and sex matched database percentile: 17   4. Mild aneurysmal dilatation of the ascending thoracic aorta. Recommend annual imaging followup by CTA or MRA. This recommendation follows 2010 ACCF/AHA/AATS/ACR/ASA/SCA/SCAI/SIR/STS/SVM Guidelines for the Diagnosis and Management of Patients with Thoracic Aortic Disease. Circulation. 2010; 121: Z733-z630. Aortic aneurysm NOS (ICD10-I71.9) Past Medical History:  Diagnosis Date   Adenomatous polyp of colon 06/20/2003   BPH (benign prostatic hypertrophy)    DDD (degenerative disc disease)    Diverticulosis    ED (erectile dysfunction)    GERD (gastroesophageal reflux disease)    Hyperlipidemia    Hypertension    Osteoarthritis     Past Surgical History:  Procedure Laterality Date   CERVICAL FUSION  1997   C3-4, C4-5, C5-6   EYE SURGERY Bilateral    cataract extraction   LUMBAR LAMINECTOMY     REPLANTATION FINGER Right    Index finger   TONSILLECTOMY     TOTAL HIP  ARTHROPLASTY Right     MEDICATIONS:  ibandronate (BONIVA) 150 MG tablet   lisinopril (PRINIVIL,ZESTRIL) 10 MG tablet   REPATHA SURECLICK 140 MG/ML SOAJ   sildenafil (REVATIO) 20 MG tablet   No current facility-administered medications for this encounter.   Burnard CHRISTELLA [name] Short Stay/Anesthesiology [facility] Phone [phone] 09/24/2024 11:10 AM

## 2024-09-24 NOTE — Anesthesia Preprocedure Evaluation (Signed)
 Anesthesia Evaluation  Patient identified by MRN, date of birth, ID band Patient awake    Reviewed: Allergy & Precautions, NPO status , Patient's Chart, lab work & pertinent test results  History of Anesthesia Complications Negative for: history of anesthetic complications  Airway Mallampati: II  TM Distance: >3 FB Neck ROM: Full    Dental  (+) Dental Advisory Given, Teeth Intact   Pulmonary neg pulmonary ROS   Pulmonary exam normal        Cardiovascular hypertension, Pt. on medications (-) angina Normal cardiovascular exam   4.2cm TAA   Neuro/Psych negative neurological ROS  negative psych ROS   GI/Hepatic Neg liver ROS,GERD  Controlled,,  Endo/Other  negative endocrine ROS    Renal/GU negative Renal ROS     Musculoskeletal  (+) Arthritis ,    Abdominal   Peds  Hematology negative hematology ROS (+)   Anesthesia Other Findings   Reproductive/Obstetrics                              Anesthesia Physical Anesthesia Plan  ASA: 2  Anesthesia Plan: General   Post-op Pain Management: Tylenol  PO (pre-op)*   Induction: Intravenous  PONV Risk Score and Plan: 2 and Treatment may vary due to age or medical condition, Ondansetron  and Propofol  infusion  Airway Management Planned: LMA  Additional Equipment: None  Intra-op Plan:   Post-operative Plan: Extubation in OR  Informed Consent: I have reviewed the patients History and Physical, chart, labs and discussed the procedure including the risks, benefits and alternatives for the proposed anesthesia with the patient or authorized representative who has indicated his/her understanding and acceptance.     Dental advisory given  Plan Discussed with: CRNA and Anesthesiologist  Anesthesia Plan Comments: (See PAT note from 12/12)         Anesthesia Quick Evaluation

## 2024-09-26 ENCOUNTER — Encounter (HOSPITAL_COMMUNITY): Payer: Self-pay | Admitting: Surgery

## 2024-09-26 ENCOUNTER — Ambulatory Visit (HOSPITAL_COMMUNITY): Payer: Self-pay | Admitting: Anesthesiology

## 2024-09-26 ENCOUNTER — Encounter (HOSPITAL_COMMUNITY): Payer: Self-pay | Admitting: Medical

## 2024-09-26 ENCOUNTER — Ambulatory Visit (HOSPITAL_COMMUNITY): Admission: RE | Admit: 2024-09-26 | Discharge: 2024-09-26 | Disposition: A | Attending: Surgery | Admitting: Surgery

## 2024-09-26 ENCOUNTER — Encounter (HOSPITAL_COMMUNITY): Admission: RE | Disposition: A | Payer: Self-pay | Source: Home / Self Care | Attending: Surgery

## 2024-09-26 DIAGNOSIS — K4090 Unilateral inguinal hernia, without obstruction or gangrene, not specified as recurrent: Secondary | ICD-10-CM

## 2024-09-26 DIAGNOSIS — Z79899 Other long term (current) drug therapy: Secondary | ICD-10-CM | POA: Insufficient documentation

## 2024-09-26 DIAGNOSIS — E785 Hyperlipidemia, unspecified: Secondary | ICD-10-CM | POA: Diagnosis not present

## 2024-09-26 DIAGNOSIS — Z96641 Presence of right artificial hip joint: Secondary | ICD-10-CM | POA: Insufficient documentation

## 2024-09-26 DIAGNOSIS — I10 Essential (primary) hypertension: Secondary | ICD-10-CM | POA: Diagnosis not present

## 2024-09-26 HISTORY — PX: INGUINAL HERNIA REPAIR: SHX194

## 2024-09-26 SURGERY — REPAIR, HERNIA, INGUINAL, ADULT
Anesthesia: General | Site: Abdomen | Laterality: Right

## 2024-09-26 MED ORDER — LIDOCAINE HCL (CARDIAC) PF 100 MG/5ML IV SOSY
PREFILLED_SYRINGE | INTRAVENOUS | Status: DC | PRN
  Administered 2024-09-26: 08:00:00 40 mg via INTRAVENOUS

## 2024-09-26 MED ORDER — CEFAZOLIN SODIUM-DEXTROSE 2-4 GM/100ML-% IV SOLN
2.0000 g | INTRAVENOUS | Status: AC
  Administered 2024-09-26: 08:00:00 2 g via INTRAVENOUS
  Filled 2024-09-26: qty 100

## 2024-09-26 MED ORDER — OXYCODONE HCL 5 MG PO TABS: 5.0000 mg | ORAL_TABLET | Freq: Once | ORAL | Status: DC | PRN

## 2024-09-26 MED ORDER — BUPIVACAINE-EPINEPHRINE (PF) 0.25% -1:200000 IJ SOLN
INTRAMUSCULAR | Status: AC
  Filled 2024-09-26: qty 90

## 2024-09-26 MED ORDER — FENTANYL CITRATE (PF) 50 MCG/ML IJ SOSY: 25.0000 ug | PREFILLED_SYRINGE | INTRAMUSCULAR | Status: DC | PRN

## 2024-09-26 MED ORDER — AMISULPRIDE (ANTIEMETIC) 5 MG/2ML IV SOLN
INTRAVENOUS | Status: AC
  Filled 2024-09-26: qty 4

## 2024-09-26 MED ORDER — CHLORHEXIDINE GLUCONATE 0.12 % MT SOLN
15.0000 mL | Freq: Once | OROMUCOSAL | Status: AC
  Administered 2024-09-26: 07:00:00 15 mL via OROMUCOSAL

## 2024-09-26 MED ORDER — ACETAMINOPHEN 325 MG PO TABS: 650.0000 mg | ORAL_TABLET | ORAL | Status: DC | PRN

## 2024-09-26 MED ORDER — BUPIVACAINE-EPINEPHRINE (PF) 0.25% -1:200000 IJ SOLN
INTRAMUSCULAR | Status: DC | PRN
  Administered 2024-09-26: 09:00:00 10 mL
  Administered 2024-09-26: 10:00:00 40 mL

## 2024-09-26 MED ORDER — ACETAMINOPHEN 650 MG RE SUPP: 650.0000 mg | RECTAL | Status: DC | PRN

## 2024-09-26 MED ORDER — PROPOFOL 10 MG/ML IV BOLUS
INTRAVENOUS | Status: AC
  Filled 2024-09-26: qty 20

## 2024-09-26 MED ORDER — ONDANSETRON HCL 4 MG/2ML IJ SOLN: 4.0000 mg | Freq: Once | INTRAMUSCULAR | Status: DC | PRN

## 2024-09-26 MED ORDER — ORAL CARE MOUTH RINSE: 15.0000 mL | Freq: Once | OROMUCOSAL | Status: AC

## 2024-09-26 MED ORDER — FENTANYL CITRATE (PF) 100 MCG/2ML IJ SOLN
INTRAMUSCULAR | Status: DC | PRN
  Administered 2024-09-26: 08:00:00 50 ug via INTRAVENOUS
  Administered 2024-09-26 (×6): 25 ug via INTRAVENOUS

## 2024-09-26 MED ORDER — CHLORHEXIDINE GLUCONATE 4 % EX SOLN: 60.0000 mL | Freq: Once | CUTANEOUS | Status: DC

## 2024-09-26 MED ORDER — OXYCODONE HCL 5 MG PO TABS: 5.0000 mg | ORAL_TABLET | Freq: Three times a day (TID) | ORAL | 0 refills | Status: AC | PRN

## 2024-09-26 MED ORDER — LACTATED RINGERS IV SOLN: INTRAVENOUS | Status: DC

## 2024-09-26 MED ORDER — EPHEDRINE SULFATE-NACL 50-0.9 MG/10ML-% IV SOSY
PREFILLED_SYRINGE | INTRAVENOUS | Status: DC | PRN
  Administered 2024-09-26: 08:00:00 5 mg via INTRAVENOUS

## 2024-09-26 MED ORDER — ACETAMINOPHEN 500 MG PO TABS: 1000.0000 mg | ORAL_TABLET | Freq: Once | ORAL | Status: DC

## 2024-09-26 MED ORDER — SODIUM CHLORIDE 0.9 % IR SOLN
Status: DC | PRN
  Administered 2024-09-26: 09:00:00 1000 mL

## 2024-09-26 MED ORDER — FENTANYL CITRATE (PF) 100 MCG/2ML IJ SOLN
INTRAMUSCULAR | Status: AC
  Filled 2024-09-26: qty 2

## 2024-09-26 MED ORDER — DOCUSATE SODIUM 100 MG PO CAPS: 100.0000 mg | ORAL_CAPSULE | Freq: Two times a day (BID) | ORAL | 0 refills | Status: AC

## 2024-09-26 MED ORDER — BUPIVACAINE LIPOSOME 1.3 % IJ SUSP
INTRAMUSCULAR | Status: AC
  Filled 2024-09-26: qty 20

## 2024-09-26 MED ORDER — ACETAMINOPHEN 500 MG PO TABS
1000.0000 mg | ORAL_TABLET | ORAL | Status: AC
  Administered 2024-09-26: 07:00:00 1000 mg via ORAL
  Filled 2024-09-26: qty 2

## 2024-09-26 MED ORDER — BUPIVACAINE LIPOSOME 1.3 % IJ SUSP: 20.0000 mL | Freq: Once | INTRAMUSCULAR | Status: DC

## 2024-09-26 MED ORDER — DEXAMETHASONE SOD PHOSPHATE PF 10 MG/ML IJ SOLN
INTRAMUSCULAR | Status: DC | PRN
  Administered 2024-09-26: 09:00:00 4 mg via INTRAVENOUS

## 2024-09-26 MED ORDER — PROPOFOL 10 MG/ML IV BOLUS
INTRAVENOUS | Status: DC | PRN
  Administered 2024-09-26: 08:00:00 140 mg via INTRAVENOUS

## 2024-09-26 MED ORDER — ROCURONIUM BROMIDE 10 MG/ML (PF) SYRINGE
PREFILLED_SYRINGE | INTRAVENOUS | Status: AC
  Filled 2024-09-26: qty 10

## 2024-09-26 MED ORDER — OXYCODONE HCL 5 MG PO TABS: 5.0000 mg | ORAL_TABLET | ORAL | Status: DC | PRN

## 2024-09-26 MED ORDER — AMISULPRIDE (ANTIEMETIC) 5 MG/2ML IV SOLN
10.0000 mg | Freq: Once | INTRAVENOUS | Status: AC
  Administered 2024-09-26: 11:00:00 10 mg via INTRAVENOUS

## 2024-09-26 MED ORDER — ONDANSETRON HCL 4 MG/2ML IJ SOLN
INTRAMUSCULAR | Status: AC
  Filled 2024-09-26: qty 2

## 2024-09-26 MED ORDER — GABAPENTIN 300 MG PO CAPS
300.0000 mg | ORAL_CAPSULE | ORAL | Status: AC
  Administered 2024-09-26: 07:00:00 300 mg via ORAL
  Filled 2024-09-26: qty 1

## 2024-09-26 MED ORDER — OXYCODONE HCL 5 MG/5ML PO SOLN: 5.0000 mg | Freq: Once | ORAL | Status: DC | PRN

## 2024-09-26 MED ORDER — ONDANSETRON HCL 4 MG/2ML IJ SOLN
INTRAMUSCULAR | Status: DC | PRN
  Administered 2024-09-26: 10:00:00 4 mg via INTRAVENOUS

## 2024-09-26 MED ORDER — SUGAMMADEX SODIUM 200 MG/2ML IV SOLN
INTRAVENOUS | Status: AC
  Filled 2024-09-26: qty 2

## 2024-09-26 MED FILL — Fentanyl Citrate Preservative Free (PF) Inj 100 MCG/2ML: INTRAMUSCULAR | Qty: 2 | Status: AC

## 2024-09-26 SURGICAL SUPPLY — 28 items
BAG COUNTER SPONGE SURGICOUNT (BAG) IMPLANT
BENZOIN TINCTURE PRP APPL 2/3 (GAUZE/BANDAGES/DRESSINGS) IMPLANT
BLADE CLIPPER SURG (BLADE) IMPLANT
BLADE SURG 15 STRL LF DISP TIS (BLADE) ×1 IMPLANT
CHLORAPREP W/TINT 26 (MISCELLANEOUS) ×1 IMPLANT
COVER SURGICAL LIGHT HANDLE (MISCELLANEOUS) ×1 IMPLANT
DRAIN PENROSE 0.5X18 (DRAIN) ×1 IMPLANT
DRAPE LAPAROSCOPIC ABDOMINAL (DRAPES) ×1 IMPLANT
ELECT REM PT RETURN 15FT ADLT (MISCELLANEOUS) ×1 IMPLANT
GAUZE SPONGE 4X4 12PLY STRL (GAUZE/BANDAGES/DRESSINGS) IMPLANT
GLOVE BIO SURGEON STRL SZ 6 (GLOVE) ×1 IMPLANT
GLOVE INDICATOR 6.5 STRL GRN (GLOVE) ×1 IMPLANT
GOWN STRL REUS W/ TWL LRG LVL3 (GOWN DISPOSABLE) ×1 IMPLANT
KIT BASIN OR (CUSTOM PROCEDURE TRAY) ×1 IMPLANT
KIT TURNOVER KIT A (KITS) ×1 IMPLANT
MARKER SKIN DUAL TIP RULER LAB (MISCELLANEOUS) ×1 IMPLANT
MESH ULTRAPRO 3X6 7.6X15CM (Mesh General) IMPLANT
NDL HYPO 22X1.5 SAFETY MO (MISCELLANEOUS) ×1 IMPLANT
NEEDLE HYPO 22X1.5 SAFETY MO (MISCELLANEOUS) ×1 IMPLANT
PACK GENERAL/GYN (CUSTOM PROCEDURE TRAY) ×1 IMPLANT
STRIP CLOSURE SKIN 1/2X4 (GAUZE/BANDAGES/DRESSINGS) IMPLANT
SUT ETHIBOND 0 MO6 C/R (SUTURE) ×1 IMPLANT
SUT MNCRL AB 4-0 PS2 18 (SUTURE) ×1 IMPLANT
SUT PDS AB 0 CT1 36 (SUTURE) ×2 IMPLANT
SUT VIC AB 3-0 SH 27XBRD (SUTURE) ×2 IMPLANT
SUT VICRYL 3 0 BR 18 UND (SUTURE) ×1 IMPLANT
SYR 20ML LL LF (SYRINGE) IMPLANT
TOWEL OR DSP ST BLU DLX 10/PK (DISPOSABLE) ×1 IMPLANT

## 2024-09-26 NOTE — Discharge Instructions (Signed)
 HERNIA REPAIR: POST OP INSTRUCTIONS   EAT Gradually transition to your usual diet over the next few days after discharge.  WALK Walk an hour a day (cumulative- not all at once).  Control your pain to do that.    CONTROL PAIN Control pain so that you can walk, sleep, tolerate sneezing/coughing, and go up/down stairs.  HAVE A BOWEL MOVEMENT DAILY Keep your bowels regular to avoid problems.  OK to try a laxative to override constipation.  OK to use an antidiarrheal to slow down diarrhea.  Call if not better after 2 tries  CALL IF YOU HAVE PROBLEMS/CONCERNS Call if you are still struggling despite following these instructions. Call if you have concerns not answered by these instructions  ######################################################################    DIET: Follow a light bland diet & liquids the first 24 hours after arrival home, such as soup, liquids, starches, etc.  Be sure to drink plenty of fluids.  Quickly advance to a usual solid diet within a few days.  Avoid fast food or heavy meals initially as you are more likely to get nauseated or have irregular bowels.  Take your usually prescribed home medications unless otherwise directed.  PAIN CONTROL: Pain is best controlled by a usual combination of three different methods TOGETHER: Ice/Heat Over the counter pain medication Prescription pain medication Most patients will experience some swelling and bruising around the hernia(s) such as the bellybutton, groins, or old incisions.  Ice packs or heating pads (30-60 minutes up to 6 times a day) will help. Use ice for the first few days to help decrease swelling and bruising, then switch to heat to help relax tight/sore spots and speed recovery.  Some people prefer to use ice alone, heat alone, alternating between ice & heat.  Experiment to what works for you.  Swelling and bruising can take several weeks to resolve.   It is helpful to take an over-the-counter pain medication  regularly for the first days: Naproxen (Aleve, etc)  Two 220mg  tabs twice a day OR Ibuprofen (Advil, etc) Three 200mg  tabs four times a day (every meal & bedtime) AND Acetaminophen  (Tylenol , etc) 325-650mg  four times a day (every meal & bedtime) A  prescription for pain medication should be given to you upon discharge.  Take your pain medication as prescribed, IF NEEDED.  If you are having problems/concerns with the prescription medicine (does not control pain, nausea, vomiting, rash, itching, etc), please call us  [phone] to see if we need to switch you to a different pain medicine that will work better for you and/or control your side effect better. If you need a refill on your pain medication, please contact your pharmacy.  They will contact our office to request authorization. Prescriptions will not be filled after 5 pm or on week-ends.  Avoid getting constipated.  Between the surgery and the pain medications, it is common to experience some constipation.  Increasing fluid intake and taking a fiber supplement (such as Metamucil, Citrucel, FiberCon, MiraLax, etc) 1-2 times a day regularly will usually help prevent this problem from occurring.  A mild laxative (prune juice, Milk of Magnesia, MiraLax, etc) should be taken according to package directions if there are no bowel movements after 48 hours.    Wash / shower every day, starting 2 days after surgery.  You may shower over the steri strips which are waterproof.  No rubbing, scrubbing, lotions or ointments to incision(s). Do not soak or submerge.   Remove your outer bandage (gauze and tape) 2  days after surgery. Steri strips (small white tapes directly on incision) will peel off after 1-2 weeks.  You may leave the incision open to air.  You may replace a dressing/Band-Aid to cover an incision for comfort if you wish.  Continue to shower over incision(s) after the dressing is off.  ACTIVITIES as tolerated:   You may resume regular (light)  daily activities beginning the next day--such as daily self-care, walking, climbing stairs--gradually increasing activities as tolerated.  Control your pain so that you can walk an hour a day.  If you can walk 30 minutes without difficulty, it is safe to try more intense activity such as jogging, treadmill, bicycling, low-impact aerobics, swimming, etc. Refrain from the most intensive and strenuous activity such as sit-ups, heavy lifting, contact sports, etc  Refrain from any heavy lifting or straining until 6 weeks after surgery.   DO NOT PUSH THROUGH PAIN.  Let pain be your guide: If it hurts to do something, don't do it.  Pain is your body warning you to avoid that activity for another week until the pain goes down. You may drive when you are no longer taking prescription pain medication, you can comfortably wear a seatbelt, and you can safely maneuver your car and apply brakes. You may have sexual intercourse when it is comfortable.   FOLLOW UP in our office Please call [facility] at [phone] to set up an appointment to see your surgeon in the office for a follow-up appointment approximately 2-3 weeks after your surgery. Make sure that you call for this appointment the day you arrive home to insure a convenient appointment time.  9.  If you have disability of FMLA / Family leave forms, please bring the forms to the office for processing.  (do not give to your surgeon).  WHEN TO CALL US  [phone]: Poor pain control Reactions / problems with new medications (rash/itching, nausea, etc)  Fever over 101.5 F (38.5 C) Inability to urinate Nausea and/or vomiting Worsening swelling or bruising Continued bleeding from incision. Increased pain, redness, or drainage from the incision   The clinic staff is available to answer your questions during regular business hours (8:30am-5pm).  Please dont hesitate to call and ask to speak to one of our nurses for clinical concerns.   If you have a medical  emergency, go to the nearest emergency room or call 911.  A surgeon from [facility] is always on call at the hospitals in [facility], [place] [address], [place], [place]  [address] ?  [address] [address], [place], [place]   [address] MAIN: [phone] ? TOLL FREE: [phone] ? FAX: [phone] [email or web address]

## 2024-09-26 NOTE — Transfer of Care (Signed)
 Immediate Anesthesia Transfer of Care Note  Patient: Frank Vance  Procedure(s) Performed: REPAIR, HERNIA, INGUINAL, ADULT, WITH MESH (Right: Abdomen)  Patient Location: PACU  Anesthesia Type:General  Level of Consciousness: awake, drowsy, and responds to stimulation  Airway & Oxygen Therapy: Patient Spontanous Breathing and Patient connected to face mask oxygen  Post-op Assessment: Report given to RN and Post -op Vital signs reviewed and stable  Post vital signs: Reviewed and stable  Last Vitals:  Vitals Value Taken Time  BP 151/79 09/26/24 10:06  Temp    Pulse 67 09/26/24 10:08  Resp 21 09/26/24 10:08  SpO2 98 % 09/26/24 10:08  Vitals shown include unfiled device data.  Last Pain:  Vitals:   09/26/24 0641  TempSrc:   PainSc: 0-No pain         Complications: No notable events documented.

## 2024-09-26 NOTE — Anesthesia Postprocedure Evaluation (Signed)
 Anesthesia Post Note  Patient: Frank Vance  Procedure(s) Performed: REPAIR, HERNIA, INGUINAL, ADULT, WITH MESH (Right: Abdomen)     Patient location during evaluation: PACU Anesthesia Type: General Level of consciousness: awake and alert Pain management: pain level controlled Vital Signs Assessment: post-procedure vital signs reviewed and stable Respiratory status: spontaneous breathing, nonlabored ventilation and respiratory function stable Cardiovascular status: stable, blood pressure returned to baseline and bradycardic Anesthetic complications: no   No notable events documented.  Last Vitals:  Vitals:   09/26/24 1045 09/26/24 1100  BP: (!) 149/89 (!) 155/93  Pulse: (!) 55 (!) 54  Resp: 19 14  Temp:    SpO2: 96% 100%    Last Pain:  Vitals:   09/26/24 1100  TempSrc:   PainSc: 0-No pain                 Debby FORBES Like

## 2024-09-26 NOTE — Anesthesia Procedure Notes (Signed)
 Procedure Name: LMA Insertion Date/Time: 09/26/2024 8:28 AM  Performed by: Metta Andrea NOVAK, CRNAPre-anesthesia Checklist: Patient identified, Emergency Drugs available, Suction available, Patient being monitored and Timeout performed Patient Re-evaluated:Patient Re-evaluated prior to induction Oxygen Delivery Method: Circle system utilized Preoxygenation: Pre-oxygenation with 100% oxygen Induction Type: IV induction Ventilation: Mask ventilation without difficulty LMA: LMA inserted LMA Size: 4.0 Number of attempts: 1 Placement Confirmation: positive ETCO2 Tube secured with: Tape Dental Injury: Teeth and Oropharynx as per pre-operative assessment

## 2024-09-26 NOTE — Op Note (Signed)
 Operative Note  GERSHOM BROBECK  [id number]  [id number]  09/26/2024   Surgeon: Mitzie DELENA Freund MD FACS   Procedure performed: Open right inguinal hernia repair with mesh   Preop diagnosis:  right inguinal hernia   Post-op diagnosis/intraop findings: indirect inguinal hernia   Specimens: none   EBL: 5cc   Complications: none   Description of procedure: After confirming informed consent, the patient was taken to the operating room and placed supine on the operating room table where general LMA anesthesia was initiated, preoperative antibiotics were administered, SCDs applied, and a formal timeout was performed. The groin was clipped, prepped and draped in the usual sterile fashion. An oblique incision was made the just above the inguinal ligament after infiltrating the tissues with local anesthetic (exparel  mixed with 0.25% marcaine  with epinephrine ). Soft tissues were dissected using electrocautery until the external oblique aponeurosis was encountered. This was divided sharply to expand the external ring. A plane was bluntly developed between the spermatic cord and the external oblique. The ilioinguinal nerve was divided between hemostats and ligated with 3-0 vicryl ties. The spermatic cord was then bluntly dissected away from the pubic tubercle and encircled with a Penrose. Inspection of the inguinal anatomy revealed a moderate indirect sac and disruption of the inguinal floor. The indirect hernia sac was bluntly dissected away from the cord structures and skeletonized to the level of the internal ring, where it was reduced intact into the abdomen.  A small cord lipoma was excised, ligating the pedicle with a 3-0 vicryl tie. The inguinal floor was reconstructed suturing the conjoint tendon to the inguinal ligament with interrupted 0 PDS, leaving an internal ring just sufficient for the cord structures. A 3 x 6 piece of ultra Pro mesh was brought onto the field and trimmed to approximate the  field. This was sutured to the pubic tubercle fascia, inferior shelving edge and to the internal oblique superiorly with interrupted 0 ethibonds. The tails of the mesh were wrapped around the spermatic cord, ensuring adequate room for the cord, and sutured to each other with 0 ethibond, and then directed laterally to lie flat beneath the external oblique aponeurosis. An additional 0 ethibond was placed medially to reinforce the slit in the mesh. Hemostasis was ensured within the wound. The Penrose was removed. The external oblique aponeurosis was reapproximated with a running 3-0 Vicryl to re-create a narrowed external ring. More local was infiltrated around the pubic tubercle and in the plane just below the external oblique. The Scarpa's was reapproximated with interrupted 3-0 Vicryls. The skin was closed with a running subcuticular 4-0 Monocryl. The remainder of the local was injected in the subcutaneous and subcuticular space. The field was then cleaned, benzoin and Steri-Strips and sterile bandage were applied. The patient was then awakened extubated and taken to PACU in stable condition.    All counts were correct at the completion of the case

## 2024-09-26 NOTE — Interval H&P Note (Signed)
 History and Physical Interval Note:  09/26/2024 8:05 AM  Frank Vance  has presented today for surgery, with the diagnosis of INGUINAL HERNIA.  The various methods of treatment have been discussed with the patient and family. After consideration of risks, benefits and other options for treatment, the patient has consented to  Procedures with comments: REPAIR, HERNIA, INGUINAL, ADULT (Right) - OPEN RIGHT INGUINAL HERNIA REPAIR as a surgical intervention.  The patient's history has been reviewed, patient examined, no change in status, stable for surgery.  I have reviewed the patient's chart and labs.  Questions were answered to the patient's satisfaction.     [name]

## 2024-09-27 ENCOUNTER — Encounter (HOSPITAL_COMMUNITY): Payer: Self-pay | Admitting: Surgery

## 2024-10-31 ENCOUNTER — Ambulatory Visit (HOSPITAL_COMMUNITY)
Admission: RE | Admit: 2024-10-31 | Discharge: 2024-10-31 | Disposition: A | Discharge status: Home / Self Care | Source: Ambulatory Visit | Attending: Cardiology | Admitting: Cardiology

## 2024-10-31 DIAGNOSIS — I10 Essential (primary) hypertension: Secondary | ICD-10-CM | POA: Diagnosis not present

## 2024-10-31 DIAGNOSIS — I77810 Thoracic aortic ectasia: Secondary | ICD-10-CM | POA: Insufficient documentation

## 2024-10-31 DIAGNOSIS — I358 Other nonrheumatic aortic valve disorders: Secondary | ICD-10-CM | POA: Insufficient documentation

## 2024-10-31 LAB — ECHOCARDIOGRAM COMPLETE
Area-P 1/2: 3.37 cm2
S' Lateral: 2.7 cm
